# Patient Record
Sex: Female | Born: 1955
Health system: Southern US, Community
[De-identification: ages and names within clinical notes are randomized; demographics above are authoritative.]

---

## 2015-06-05 ENCOUNTER — Encounter: Payer: Self-pay | Admitting: Internal Medicine

## 2015-06-05 ENCOUNTER — Ambulatory Visit (INDEPENDENT_AMBULATORY_CARE_PROVIDER_SITE_OTHER): Payer: Self-pay | Admitting: Internal Medicine

## 2015-06-05 VITALS — BP 191/124 | HR 76 | Wt 285.0 lb

## 2015-06-05 DIAGNOSIS — Z90710 Acquired absence of both cervix and uterus: Secondary | ICD-10-CM | POA: Insufficient documentation

## 2015-06-05 DIAGNOSIS — I10 Essential (primary) hypertension: Secondary | ICD-10-CM | POA: Insufficient documentation

## 2015-06-05 DIAGNOSIS — R600 Localized edema: Secondary | ICD-10-CM

## 2015-06-05 DIAGNOSIS — L304 Erythema intertrigo: Secondary | ICD-10-CM | POA: Insufficient documentation

## 2015-06-05 DIAGNOSIS — L03119 Cellulitis of unspecified part of limb: Secondary | ICD-10-CM

## 2015-06-05 DIAGNOSIS — S82209A Unspecified fracture of shaft of unspecified tibia, initial encounter for closed fracture: Secondary | ICD-10-CM | POA: Insufficient documentation

## 2015-06-05 DIAGNOSIS — Z9049 Acquired absence of other specified parts of digestive tract: Secondary | ICD-10-CM | POA: Insufficient documentation

## 2015-06-05 DIAGNOSIS — S82409A Unspecified fracture of shaft of unspecified fibula, initial encounter for closed fracture: Secondary | ICD-10-CM

## 2015-06-05 DIAGNOSIS — E669 Obesity, unspecified: Secondary | ICD-10-CM | POA: Insufficient documentation

## 2015-06-05 DIAGNOSIS — Z0000 Encounter for general adult medical examination without abnormal findings: Secondary | ICD-10-CM

## 2015-06-05 LAB — BASIC METABOLIC PANEL
BUN: 13 mg/dL (ref 7–25)
CO2: 23 meq/L (ref 20–31)
CREATININE: 0.79 mg/dL (ref 0.50–1.05)
Calcium: 9.3 mg/dL (ref 8.6–10.4)
Chloride: 104 mEq/L (ref 98–110)
GLUCOSE: 103 mg/dL — AB (ref 65–99)
POTASSIUM: 4.3 meq/L (ref 3.5–5.3)
Sodium: 139 mEq/L (ref 135–146)

## 2015-06-05 LAB — CBC WITH DIFFERENTIAL/PLATELET
Basophils Absolute: 0 10*3/uL (ref 0.0–0.1)
Basophils Relative: 0 % (ref 0–1)
Eosinophils Absolute: 0.2 10*3/uL (ref 0.0–0.7)
Eosinophils Relative: 2 % (ref 0–5)
HCT: 43.6 % (ref 36.0–46.0)
HEMOGLOBIN: 14.4 g/dL (ref 12.0–15.0)
Lymphocytes Relative: 22 % (ref 12–46)
Lymphs Abs: 2 10*3/uL (ref 0.7–4.0)
MCH: 29.6 pg (ref 26.0–34.0)
MCHC: 33 g/dL (ref 30.0–36.0)
MCV: 89.5 fL (ref 78.0–100.0)
MPV: 9.7 fL (ref 8.6–12.4)
Monocytes Absolute: 0.8 10*3/uL (ref 0.1–1.0)
Monocytes Relative: 9 % (ref 3–12)
NEUTROS ABS: 6.2 10*3/uL (ref 1.7–7.7)
Neutrophils Relative %: 67 % (ref 43–77)
Platelets: 340 10*3/uL (ref 150–400)
RBC: 4.87 MIL/uL (ref 3.87–5.11)
RDW: 12.8 % (ref 11.5–15.5)
WBC: 9.2 10*3/uL (ref 4.0–10.5)

## 2015-06-05 LAB — HEPATITIS C ANTIBODY: HCV AB: NEGATIVE

## 2015-06-05 LAB — C-REACTIVE PROTEIN

## 2015-06-05 MED ORDER — HYDROCHLOROTHIAZIDE 25 MG PO TABS: 25.0000 mg | ORAL_TABLET | Freq: Every day | ORAL | Status: AC

## 2015-06-05 NOTE — Progress Notes (Addendum)
Rfv: community referral for cellulitis Subjective:    Patient ID: Sarah Joyce, female    DOB: 05-01-1956, 59 y.o.   MRN: [id number]  HPI Sarah Joyce is a 59yo F with HTN,obesity, ankle injury requiring rod & screw placement roughly 15 years ago. She sustained a spider bite to lateral aspect of right foot followed by  a bout of cellulitis to right leg and foot in mid may. She was given a total of 21 day course of cephalexin until improvement thru beginning of June. She had many recurrence of cellulitis, and has received several course of antibiotics. After keflex, she had a course of bactrim, then ceftriaxone injection followed again by bactrim course and most recently finished a course of moxifloxacin. Thus she has been on a course of antibiotics since mid may.  Has chronic erythema to her right leg, thought to be due to "leg trauma". She was referred by Dr. Thurston Joyce who felt that no surgical intervention at this time but referred her to ID for evaluation of further work up or treatment  No Known Allergies  No current outpatient prescriptions on file prior to visit.   No current facility-administered medications on file prior to visit.    Active Ambulatory Problems    Diagnosis Date Noted  . Essential hypertension 06/05/2015  . Recurrent cellulitis of lower leg 06/05/2015  . Obesity 06/05/2015  . H/O: hysterectomy 06/05/2015  . History of cholecystectomy 06/05/2015  . Intertrigo 06/05/2015   Resolved Ambulatory Problems    Diagnosis Date Noted  . No Resolved Ambulatory Problems   No Additional Past Medical History    History  Substance Use Topics  . Smoking status: Never Smoker   . Smokeless tobacco: Never Used  . Alcohol Use: Not on file   Family history = no family hx of immune deficiencies or lymphadema  Review of Systems Review of Systems  Constitutional: Negative for fever, chills, diaphoresis, activity change, appetite change, fatigue and unexpected weight change.    HENT: Negative for congestion, sore throat, rhinorrhea, sneezing, trouble swallowing and sinus pressure.  Eyes: Negative for photophobia and visual disturbance.  Respiratory: Negative for cough, chest tightness, shortness of breath, wheezing and stridor.  Cardiovascular: Negative for chest pain, palpitations and leg swelling.  Gastrointestinal: Negative for nausea, vomiting, abdominal pain, diarrhea, constipation, blood in stool, abdominal distention and anal bleeding.  Genitourinary: Negative for dysuria, hematuria, flank pain and difficulty urinating.  Musculoskeletal: Negative for myalgias, back pain, joint swelling, arthralgias and gait problem.  Skin: Negative for color change, pallor, rash and wound. Rash to right foot neuroological: Negative for dizziness, tremors, weakness and light-headedness.  Hematological: Negative for adenopathy. Does not bruise/bleed easily.  Psychiatric/Behavioral: Negative for behavioral problems, confusion, sleep disturbance, dysphoric mood, decreased concentration and agitation.       Objective:   Physical Exam BP 210/115 mmHg  Pulse 77  Wt 285 lb (129.275 kg), repeat BP 190s Physical Exam  Constitutional:  oriented to person, place, and time. appears well-developed and well-nourished. No distress.  HENT: [place]/AT, PERRLA, no scleral icterus Mouth/Throat: Oropharynx is clear and moist. No oropharyngeal exudate.  Cardiovascular: Normal rate, regular rhythm and normal heart sounds. Exam reveals no gallop and no friction rub.  No murmur heard.  Pulmonary/Chest: Effort normal and breath sounds normal. No respiratory distress.  has no wheezes.  Neck = supple, no nuchal rigidity Lymphadenopathy: no cervical adenopathy. No axillary adenopathy Skin: Skin is warm and dry.  Erythema to lateral aspect of foot Ext: non pitting edema to  right leg   Lab Results  Component Value Date   ESRSEDRATE 12 06/05/2015   Lab Results  Component Value Date   CRP <0.5  06/05/2015       Assessment & Plan:  Possibly deep tissue infection of right foot = will check sed rate and crp. Will get foot CT. Consider also vascular evaluation. Will do a trial of ace wrap.   htn = seems still high despite having white coat syndrome. Will start hctz to see if that also helps BP as well as with lower extremity edema. Will have her get BMP early next week to see if she is having significant potassium loss which she would require oral replacement  Health maintenance = will check hiv and hep c antibody per CDC recs

## 2015-06-06 ENCOUNTER — Telehealth: Payer: Self-pay | Admitting: *Deleted

## 2015-06-06 LAB — HIV ANTIBODY (ROUTINE TESTING W REFLEX): HIV 1&2 Ab, 4th Generation: NONREACTIVE

## 2015-06-06 LAB — SEDIMENTATION RATE: Sed Rate: 12 mm/hr (ref 0–30)

## 2015-06-06 NOTE — Telephone Encounter (Signed)
Called the patient to give her appt information for the CT Foot. She is to be at [facility] radiology 06/11/15 at 745 for 800 appt. If she can not make it will give her [phone] to call and reschedule.

## 2015-06-11 ENCOUNTER — Ambulatory Visit (HOSPITAL_COMMUNITY)
Admission: RE | Admit: 2015-06-11 | Discharge: 2015-06-11 | Disposition: A | Discharge status: Home / Self Care | Payer: Self-pay | Source: Ambulatory Visit | Attending: Internal Medicine | Admitting: Internal Medicine

## 2015-06-11 DIAGNOSIS — L03119 Cellulitis of unspecified part of limb: Secondary | ICD-10-CM

## 2015-06-11 NOTE — Telephone Encounter (Signed)
Radiology at [facility] called stating that they could not perform a CT w/wo contrast, they tried to contact us this morning but was not successful with getting through so Radiology sent the patient home. Radiologist recommends a MRI instead of CT if looking for infection. Radiologist states that they could do a CT w/o contrast if Dr. Drue Second still wants a CT scan.

## 2015-06-18 NOTE — Telephone Encounter (Signed)
Patient is still waiting on an answer so she can get her imaging done, also she is in a high amount of pain since being off the antibiotics. Please advise, patient is really concerned.

## 2015-06-21 ENCOUNTER — Telehealth: Payer: Self-pay | Admitting: *Deleted

## 2015-06-21 ENCOUNTER — Encounter: Payer: Self-pay | Admitting: Internal Medicine

## 2015-06-21 NOTE — Telephone Encounter (Signed)
Called the patient to advise her of the appt for her CT 06/26/15 at 130 pm at [facility]. The patient advised her foot is still swollen red and painful. Advised the only increase is in the pain near the bite everything else is the same. Advised her will let the doctor know and either she will call her or I will.  Dr Drue Second informed verbally.

## 2015-06-21 NOTE — Telephone Encounter (Signed)
Sarah Joyce   I am working on this right now I will try and call you shortly and have an appointment for you.  [name]

## 2015-06-22 ENCOUNTER — Telehealth: Payer: Self-pay | Admitting: Internal Medicine

## 2015-06-22 NOTE — Telephone Encounter (Signed)
Spoke to patient regarding her symptoms. She mainly complains of tightness in her leg associated with swelling. No significant increase in erythema at prior site that was involved. Recommended to keep her leg elevated. Compression stalkings. Continue with diuretics. Plan for leg CT (she has hx of hardware in her foot and ankle from remote surgery 10-65yr, not sure if it is compatible with mri)

## 2015-06-26 ENCOUNTER — Other Ambulatory Visit: Payer: Self-pay | Admitting: Internal Medicine

## 2015-06-26 ENCOUNTER — Ambulatory Visit (HOSPITAL_COMMUNITY)
Admission: RE | Admit: 2015-06-26 | Discharge: 2015-06-26 | Disposition: A | Discharge status: Home / Self Care | Payer: Self-pay | Source: Ambulatory Visit | Attending: Internal Medicine | Admitting: Internal Medicine

## 2015-06-26 ENCOUNTER — Encounter (HOSPITAL_COMMUNITY): Payer: Self-pay

## 2015-06-26 DIAGNOSIS — L03119 Cellulitis of unspecified part of limb: Secondary | ICD-10-CM

## 2015-06-26 DIAGNOSIS — Z8781 Personal history of (healed) traumatic fracture: Secondary | ICD-10-CM | POA: Insufficient documentation

## 2015-06-26 DIAGNOSIS — M1990 Unspecified osteoarthritis, unspecified site: Secondary | ICD-10-CM | POA: Insufficient documentation

## 2015-06-26 MED ORDER — IOHEXOL 350 MG/ML SOLN
80.0000 mL | Freq: Once | INTRAVENOUS | Status: AC | PRN
  Administered 2015-06-26: 80 mL via INTRAVENOUS

## 2015-07-04 NOTE — Telephone Encounter (Signed)
Patient had CT foot done 06/28/15 and is planing to keep appt set to see Dr Drue Second 07/12/15.

## 2015-07-12 ENCOUNTER — Ambulatory Visit (INDEPENDENT_AMBULATORY_CARE_PROVIDER_SITE_OTHER): Payer: Self-pay | Admitting: Internal Medicine

## 2015-07-12 ENCOUNTER — Encounter: Payer: Self-pay | Admitting: Internal Medicine

## 2015-07-12 VITALS — BP 195/119 | HR 82 | Temp 98.3°F | Wt 284.0 lb

## 2015-07-12 DIAGNOSIS — I10 Essential (primary) hypertension: Secondary | ICD-10-CM

## 2015-07-12 DIAGNOSIS — L309 Dermatitis, unspecified: Secondary | ICD-10-CM

## 2015-07-12 DIAGNOSIS — S91301A Unspecified open wound, right foot, initial encounter: Secondary | ICD-10-CM

## 2015-07-12 MED ORDER — COLLAGENASE 250 UNIT/GM EX OINT: 1.0000 | TOPICAL_OINTMENT | Freq: Every day | CUTANEOUS | Status: AC

## 2015-07-12 NOTE — Progress Notes (Signed)
Subjective:    Patient ID: Sarah Joyce, female    DOB: [birth year], 59 y.o.   MRN: [id number]  HPI 58yo F with HTN, recurrent cellulitis of leg, venous stasis changes. She was seen in July for recurrent cellulitis nad had been on intermittent oral antibiotics. At that time her inflammatory markers, wbc had been normal. She did undergo CT of right lower leg that showed evidence of edema, HW in place, but no abscess or inflammation to be concerned for myositis, cellulitis. Since we last saw her,  She has noticed right ulcer to medial aspect of midway dorsum of foot, inferior medial malleolus thinks it maybe due to her shoes. Lateral aspect of foot has red patch of flaky skin, pruritic which she itches. No other worsening erythema on dorsum of foot or swelling noted. She does have pitting edema to lower extremities and venous stasis changes. Not currently on any oral antibiotics, she uses neosporin to her ulcer on medial aspect of right foot  She does see her pcp and her bp at their office sBP 140s. Though, it is higher here. She denies any Headache  i have reviewed imaging from evaluation of right leg.  Current Outpatient Prescriptions on File Prior to Visit  Medication Sig Dispense Refill  . hydrochlorothiazide (HYDRODIURIL) 25 MG tablet Take 1 tablet (25 mg total) by mouth daily. 30 tablet 11  . HYDROcodone-acetaminophen (NORCO/VICODIN) 5-325 MG per tablet Take 1 tablet by mouth every 6 (six) hours as needed for moderate pain.    Marland Kitchen moxifloxacin (AVELOX) 400 MG tablet Take 400 mg by mouth daily at 8 pm.     No current facility-administered medications on file prior to visit.   Active Ambulatory Problems    Diagnosis Date Noted  . Recurrent cellulitis of lower leg 06/05/2015  . Obesity 06/05/2015  . H/O: hysterectomy 06/05/2015  . History of cholecystectomy 06/05/2015  . Intertrigo 06/05/2015  . Tibia/fibula fracture, shaft 06/05/2015   Resolved Ambulatory Problems    Diagnosis Date  Noted  . Essential hypertension 06/05/2015   No Additional Past Medical History     Review of Systems 10 point ros is negative except what is mentioned in hpi    Objective:   Physical Exam BP 195/119 mmHg  Pulse 82  Temp(Src) 98.3 F (36.8 C) (Oral)  Wt 284 lb (128.822 kg) gen = a x o by 3 in nad heent = no thrush Ext = trace edema up 2/3rd of shin Skin = right foot shows thtat she has a 1 x 1 cm shallow ulcer to medial aspect of dorsum of foot. Fibrinous exudate in wound bed. Lateral aspect of right foot has 4 cm high x 7 cm long dry, flaking patch. No blanching not raised  Imaging: 8/16 leg CT FINDINGS: There is no evidence of osteomyelitis, joint effusion, tenosynovitis, or soft tissue abscess. The patient has subcutaneous edema in the soft tissues of the dorsal lateral aspect of the forefoot and prominent subcutaneous edema adjacent to the medial malleolus. This less prominent subcutaneous edema adjacent to the lateral malleolus. There is moderate arthritis of the ankle joint with multiple subcortical cysts in the dome of the talus with joint space narrowing. Side plate and multiple screws in the distal fibula. The previous fracture has completely healed.  IMPRESSION: No evidence of osteomyelitis, myositis, or soft tissue abscess.  Subcutaneous edema around the ankle and on the dorsum of the foot which can represent cellulitis.  The hardware in the distal fibula is not a  contraindication to MRI if needed in the future although the hardware will obscure some detail at the level of the ankle joint.    Assessment & Plan:  Clinically it looks like lateral lesion is not consistent with cellulitis, but dermatitis, will have her do 50-50 vaseline with eucerin  For medial ulcer = has fibrinous exudate in 1 x 1 cm wound bed. Will recommend santyl daily til wound is clean then stop and let heal on its own. No need for antibiotics  htn = on repeat still elevated.  Recommend to continue with lisinopril and also have her follow up with pcp to change her regimen if needed.

## 2015-07-20 ENCOUNTER — Encounter: Payer: Self-pay | Admitting: Internal Medicine

## 2015-07-27 ENCOUNTER — Encounter: Payer: Self-pay | Admitting: Internal Medicine

## 2015-07-30 ENCOUNTER — Encounter: Payer: Self-pay | Admitting: *Deleted

## 2015-08-28 ENCOUNTER — Ambulatory Visit: Payer: Self-pay | Admitting: Internal Medicine

## 2015-09-15 IMAGING — CT CT FOOT*R* W/CM
2 series · 13 of 28 positions shown, 16 images · IV contrast (omnipaque)
Comparison: None.

CLINICAL DATA: Recurrent cellulitis of the lower leg. Spider bite 3
months ago.

EXAM:
CT OF THE RIGHT FOOT WITH CONTRAST
TECHNIQUE: Multidetector CT imaging was performed following the standard
protocol during bolus administration of intravenous contrast.
CONTRAST:  80mL OMNIPAQUE IOHEXOL 350 MG/ML SOLN

[Series 4: lfov ext 3.0 b40s · axial · 0.47mm/px · z∈[-254,-78]mm · 8 of 71 slices shown, 10 images]
[im 6/71  soft-tissue]
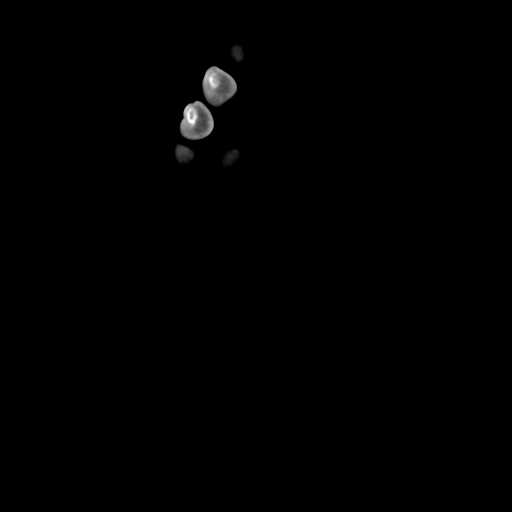
[im 6/71  bone]
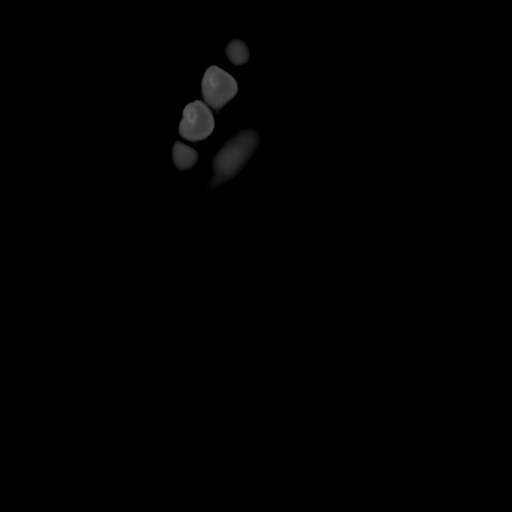
[im 17/71  bone]
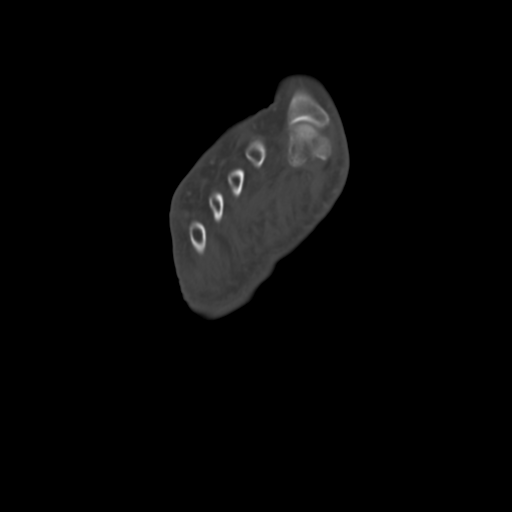
[im 22/71  bone]
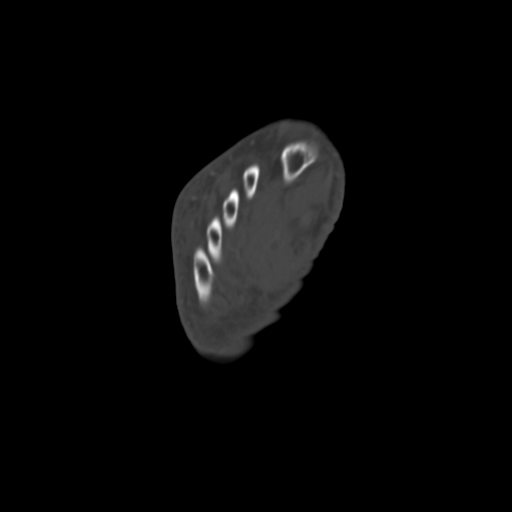
[im 33/71  bone]
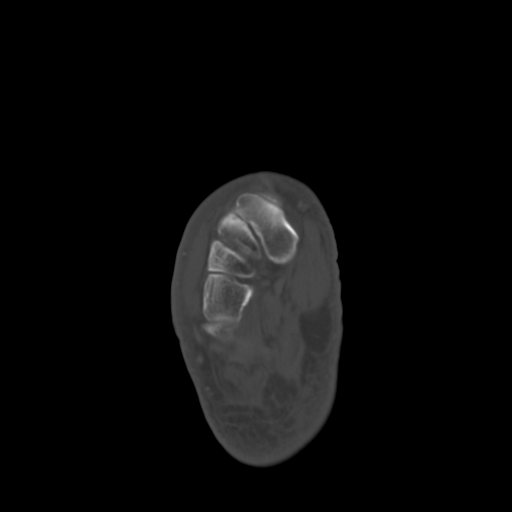
[im 38/71  soft-tissue]
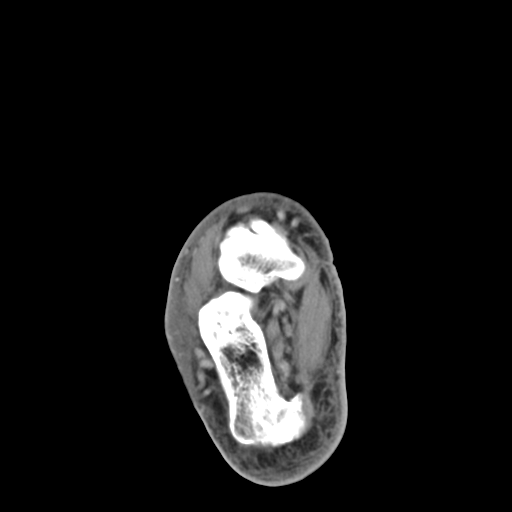
[im 38/71  bone]
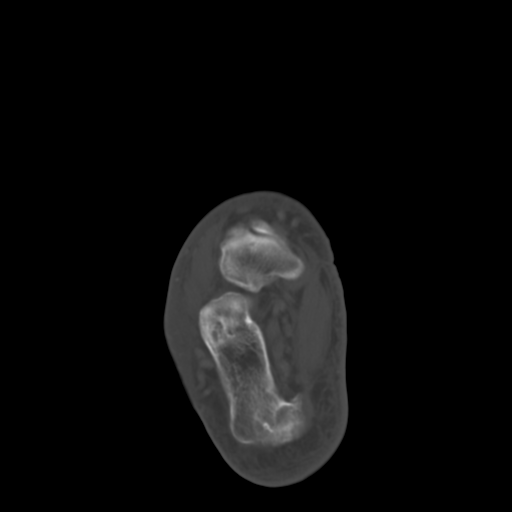
[im 49/71  bone]
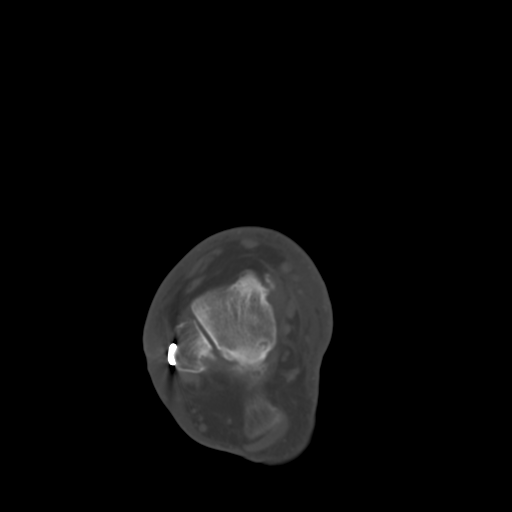
[im 54/71  bone]
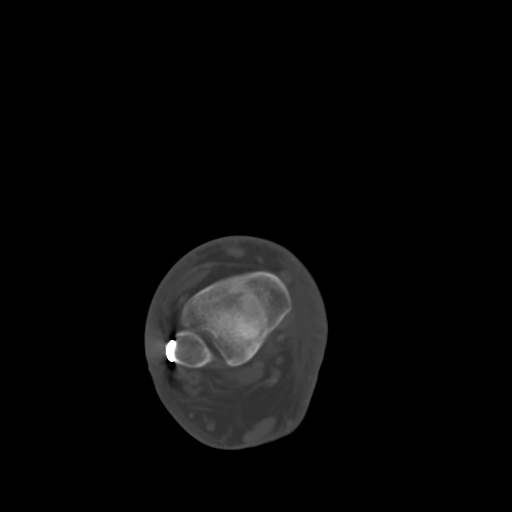
[im 65/71  bone]
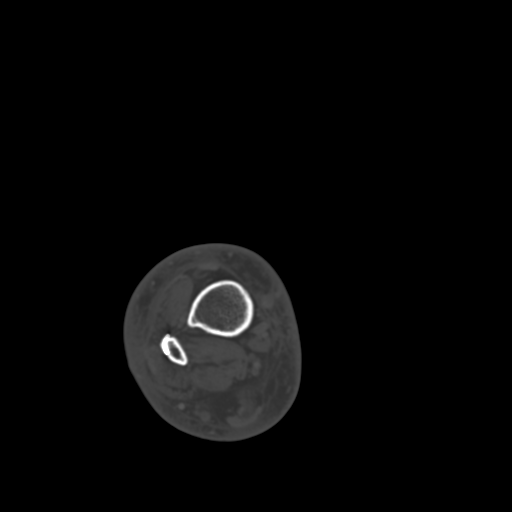

[Series 10: sagittalsoft tissue · sagittal · 0.37mm/px · 5 of 54 slices shown, 6 images]
[im 18/54  bone]
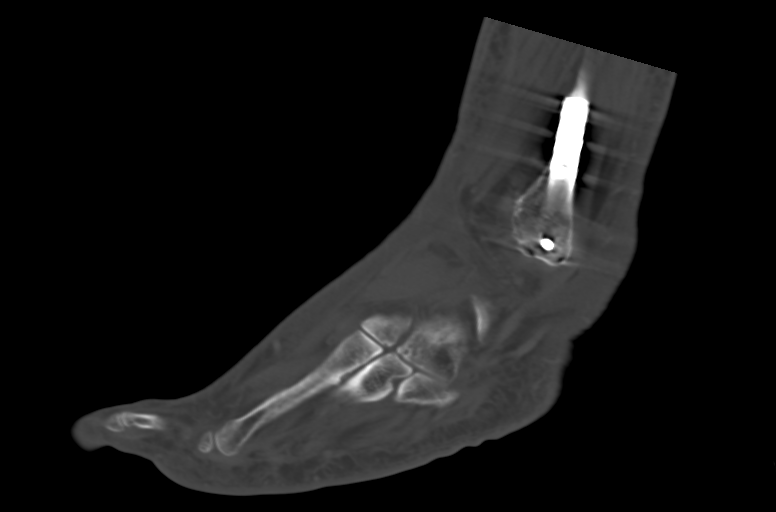
[im 23/54  bone]
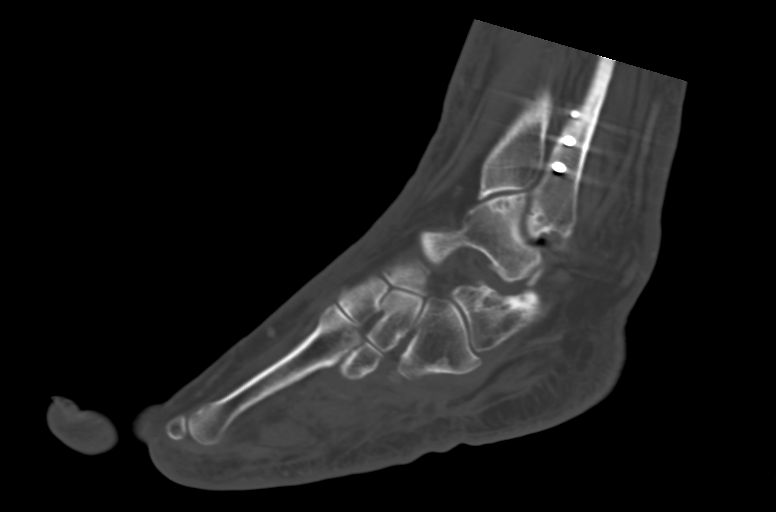
[im 27/54  soft-tissue]
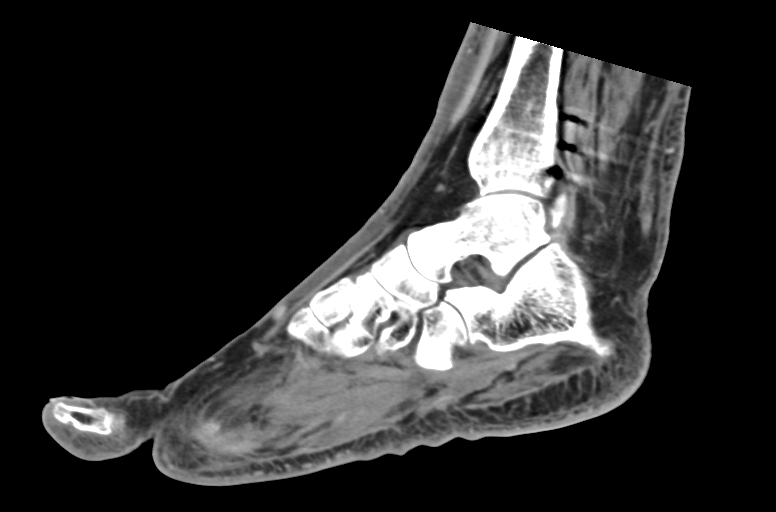
[im 27/54  bone]
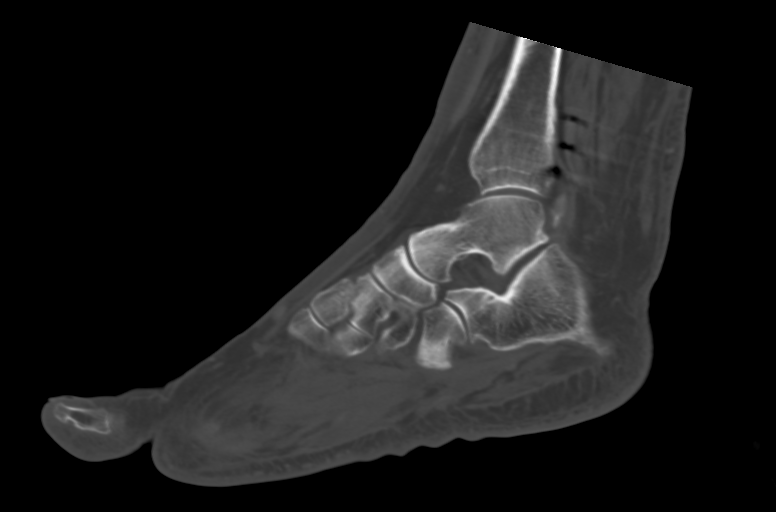
[im 31/54  bone]
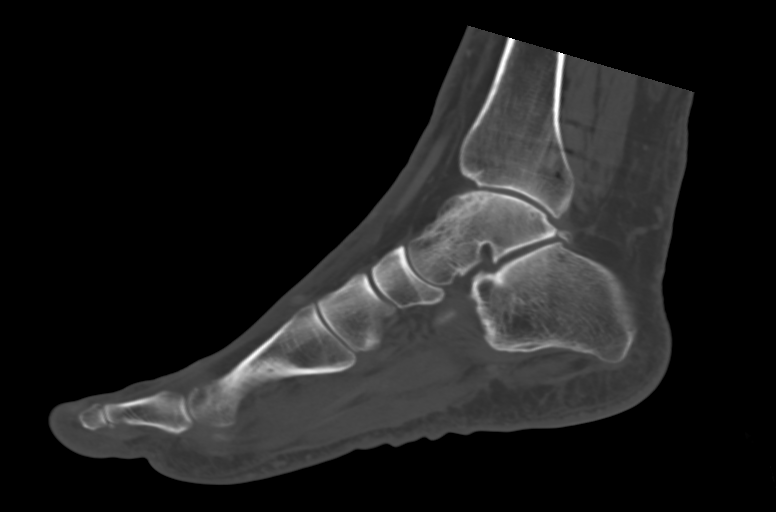
[im 36/54  bone]
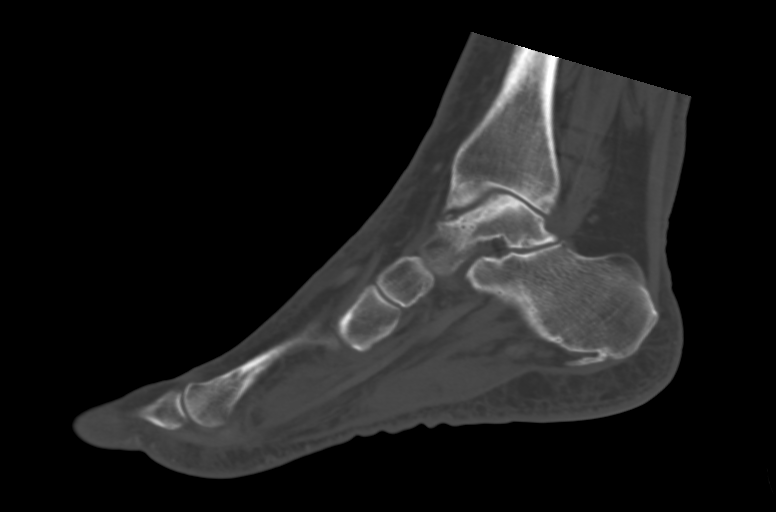

[13 of 28 positions shown; findings below may reference images not displayed]

FINDINGS: There is no evidence of osteomyelitis, joint effusion,
tenosynovitis, or soft tissue abscess. The patient has subcutaneous
edema in the soft tissues of the dorsal lateral aspect of the
forefoot and prominent subcutaneous edema adjacent to the medial
malleolus. This less prominent subcutaneous edema adjacent to the
lateral malleolus. There is moderate arthritis of the ankle joint
with multiple subcortical cysts in the dome of the talus with joint
space narrowing. Side plate and multiple screws in the distal
fibula. The previous fracture has completely healed.
IMPRESSION: No evidence of osteomyelitis, myositis, or soft tissue abscess.

Subcutaneous edema around the ankle and on the dorsum of the foot
which can represent cellulitis.

The hardware in the distal fibula is not a contraindication to MRI
if needed in the future although the hardware will obscure some
detail at the level of the ankle joint.

## 2016-07-25 ENCOUNTER — Ambulatory Visit (HOSPITAL_COMMUNITY): Payer: Self-pay | Admitting: Physical Therapy

## 2016-07-31 ENCOUNTER — Ambulatory Visit (HOSPITAL_COMMUNITY): Payer: Self-pay | Attending: Family Medicine | Admitting: Physical Therapy

## 2016-07-31 DIAGNOSIS — L97312 Non-pressure chronic ulcer of right ankle with fat layer exposed: Secondary | ICD-10-CM | POA: Insufficient documentation

## 2016-07-31 DIAGNOSIS — M79671 Pain in right foot: Secondary | ICD-10-CM | POA: Insufficient documentation

## 2016-07-31 NOTE — Therapy (Signed)
[facility] [address] [place], [place], [address] Phone: [phone]   Fax:  [phone]  Physical Therapy Evaluation  Patient Details  Name: Sarah Joyce MRN: [id number] Date of Birth: 1955/11/17 Referring Provider: Fara Chute  Encounter Date: 07/31/2016      PT End of Session - 07/31/16 1547    Visit Number 1   Number of Visits 8   Date for PT Re-Evaluation 08/30/16   Authorization Type no insurance   PT Start Time 1435   PT Stop Time 1510   PT Time Calculation (min) 35 min   Activity Tolerance Patient tolerated treatment well   Behavior During Therapy [facility] for tasks assessed/performed      No past medical history on file.  No past surgical history on file.  There were no vitals filed for this visit.       Subjective Assessment - 07/31/16 1533    Subjective Ms. Lokey states that she was bit by a brown recluce spider approximately 15 months ago and has had problems with wounds that come and go ever since.   Her current wounds which are located on the medial aspect of her ankle have been there for five months.  She has had five bouts of antibiotics and is now being referred to skilled therapy .    Patient is accompained by: Family member   Pertinent History HTN, (-) for diabetes, (-) for arterial insufficiency.    How long can you sit comfortably? no problem    How long can you stand comfortably? no difficulty   How long can you walk comfortably? no difficulty    Currently in Pain? Yes   Pain Score 7    Pain Location Ankle   Pain Orientation Right;Medial   Pain Descriptors / Indicators Aching   Pain Onset More than a month ago   Pain Frequency Intermittent   Aggravating Factors  hitting the inside of her ankle    Pain Relieving Factors medication    Effect of Pain on Daily Activities increases pain    Multiple Pain Sites No            [facility] PT Assessment - 07/31/16 0001      Assessment   Medical Diagnosis  nonhealing wound    Referring Provider Fara Chute   Onset Date/Surgical Date 02/28/16   Next MD Visit unknown   Prior Therapy 5 antibiotics; santyl      Precautions   Precautions None     Restrictions   Weight Bearing Restrictions No     Balance Screen   Has the patient fallen in the past 6 months No   Has the patient had a decrease in activity level because of a fear of falling?  No   Is the patient reluctant to leave their home because of a fear of falling?  No     Home [employer] information centre manager residence     Prior Function   Level of Independence Independent   Vocation Part time employment   Vocation Requirements standing/sitting    Leisure none      Cognition   Overall Cognitive Status Within Functional Limits for tasks assessed     Observation/Other Assessments   Observations Rt LE has increased edema; increased heat and is red.  There are multiple small scattered wounds along the medial aspect of the patient's ankle.  The pt states that these wounds tend to drain moderately. Noted varicosities throughout LE  Observation/Other Assessments-Edema    Edema --  posititve                    [facility] Adult PT Treatment/Exercise - 07/31/16 0001      Manual Therapy   Manual Therapy Compression Bandaging   Compression Bandaging profore system using extra cotton to ensure a cone shape prior to other layers of compression.                 PT Education - 07/31/16 1545    Education provided Yes   Education Details Take bandaging off if they become to painful; keep bandages on and do not get wet if they remain comfortable.  The benefits of compression hose. ( pt states that she normally wears compression hose    Person(s) Educated Patient   Methods Explanation   Comprehension Verbalized understanding          PT Short Term Goals - 07/31/16 1556      PT SHORT TERM GOAL #1   Title Pt LE to show no signs of infection    Time 1    Period Weeks   Status New     PT SHORT TERM GOAL #2   Title Pt pain to be decreaed to no more than a 4/10 to allow pt to be on her feet for over an hour for work duties    Time 1   Period Weeks   Status New     PT SHORT TERM GOAL #3   Title Pt wound drainage to be minimal to decrease risk of infection    Time 1   Period Weeks           PT Long Term Goals - 07/31/16 1557      PT LONG TERM GOAL #1   Title Pt wounds to be healed    Time 4   Period Weeks   Status New     PT LONG TERM GOAL #2   Title Pt acknowledge the importance of wearing compression garments everyday; have obtained and to be able to don and doff compression garments.    Time 4   Period Weeks   Status New               Plan - 07/31/16 1548    Clinical Impression Statement Ms. Katrinka BlazingSmith is a 60 yo female who has had multiple non-healing wounds for the past 15 months which she attributes to a spider bite.  The most recent wound has been on the medial aspect of her right ankle for the past five months.  She has been given five antibiotics and santyl to place on the wound with no improvement.  The pt LE has incresed edema with noted varicosities.  The LE is warm to the touch and red.  Ms. Katrinka BlazingSmith will benefit from skilled physical therapy to decrease her edema, decrease her pain and provice the right enviornment for her wounds to heal.     Rehab Potential Good   PT Frequency 2x / week   PT Duration 4 weeks   PT Treatment/Interventions ADLs/Self Care Home Management;Manual techniques;Manual lymph drainage;Compression bandaging;Other (comment)  debridement if needed    PT Next Visit Plan Pt may benefit from manual decongestive techniques if heat and redness are decreased, continue to use compression bandaging and give pt information on copression outlet center.    Consulted and Agree with Plan of Care Patient      Patient will benefit from skilled  therapeutic intervention in order to improve the following  deficits and impairments:  Increased edema, Pain, Other (comment) (nonhealing wounds )  Visit Diagnosis: Pain in right foot - Plan: PT plan of care cert/re-cert  Non-pressure chronic ulcer of ankle with fat layer exposed, right (HCC) - Plan: PT plan of care cert/re-cert     Problem List Patient Active Problem List   Diagnosis Date Noted  . Recurrent cellulitis of lower leg 06/05/2015  . Obesity 06/05/2015  . H/O: hysterectomy 06/05/2015  . History of cholecystectomy 06/05/2015  . Intertrigo 06/05/2015  . Tibia/fibula fracture, shaft 06/05/2015    Virgina Organ, PT CLT [phone] 07/31/2016, 4:05 PM  [facility] [address] [place], [place], [address] Phone: [phone]   Fax:  [phone]  Name: Raileigh Sabater MRN: [id number] Date of Birth: November 14, 1955

## 2016-08-05 ENCOUNTER — Ambulatory Visit (HOSPITAL_COMMUNITY): Payer: Self-pay | Attending: Family Medicine

## 2016-08-05 DIAGNOSIS — M79671 Pain in right foot: Secondary | ICD-10-CM

## 2016-08-05 DIAGNOSIS — L97312 Non-pressure chronic ulcer of right ankle with fat layer exposed: Secondary | ICD-10-CM

## 2016-08-05 NOTE — Therapy (Signed)
[facility] [address] [place], [place], [address] Phone: [phone]   Fax:  [phone]  Physical Therapy Treatment  Patient Details  Name: Sarah LawsStephanie Joyce MRN: [id number] Date of Birth: [birth year] Referring Provider: Fara ChutePaul Sasser  Encounter Date: 08/05/2016      PT End of Session - 08/05/16 1404    Visit Number 2   Number of Visits 8   Date for PT Re-Evaluation 08/30/16   Authorization Type no insurance   PT Start Time 1300   PT Stop Time 1348   PT Time Calculation (min) 48 min   Activity Tolerance Patient tolerated treatment well   Behavior During Therapy [facility] for tasks assessed/performed      No past medical history on file.  No past surgical history on file.  There were no vitals filed for this visit.      Subjective Assessment - 08/05/16 1406    Subjective Pt entered dept with two outer layers of dressinmgs removed, stated they were too tight and painful.  No reports of pain today   Pertinent History HTN, (-) for diabetes, (-) for arterial insufficiency.    Currently in Pain? No/denies           Wound Therapy - 08/05/16 1407    Subjective Pt entered dept with two outer layers of dressinmgs removed, stated they were too tight and painful.  No reports of pain today   Patient and Family Stated Goals wound to heal   Date of Onset --  15 months ago   Pain Assessment No/denies pain   Pain Score 0-No pain   Pain Location Ankle   Pain Orientation Right;Medial   Pain Descriptors / Indicators Aching   Evaluation and Treatment Procedures Explained to Patient/Family Yes   Evaluation and Treatment Procedures agreed to          [facility] Adult PT Treatment/Exercise - 08/05/16 0001      Manual Therapy   Manual Therapy Compression Bandaging   Manual therapy comments Selective debridement for removal of slough and dry skin.  Retro massage for edema control; Cleansed and debridement with silver hydrofiber dressings and  profore with extra cotton    Compression Bandaging profore system using extra cotton to ensure a cone shape prior to other layers of compression.             PT Short Term Goals - 07/31/16 1556      PT SHORT TERM GOAL #1   Title Pt LE to show no signs of infection    Time 1   Period Weeks   Status New     PT SHORT TERM GOAL #2   Title Pt pain to be decreaed to no more than a 4/10 to allow pt to be on her feet for over an hour for work duties    Time 1   Period Weeks   Status New     PT SHORT TERM GOAL #3   Title Pt wound drainage to be minimal to decrease risk of infection    Time 1   Period Weeks           PT Long Term Goals - 07/31/16 1557      PT LONG TERM GOAL #1   Title Pt wounds to be healed    Time 4   Period Weeks   Status New     PT LONG TERM GOAL #2   Title Pt acknowledge the importance of wearing compression garments everyday;  have obtained and to be able to don and doff compression garments.    Time 4   Period Weeks   Status New               Plan - 08/05/16 1708    Clinical Impression Statement Pt entered dept with 2 of the outer layers removed from dressings due to discomfort, noted dressings have slid down LE.   Pt stated overall redness has reduced for LE.  Selective debridement complete to remove slough and overall dry skin.  Retro massage complete for edema control and continued with silver hydrofiber with vaseline applied to skin.  Applied profore for edema control lighter for comfort.  No reports of pain through session.     Rehab Potential Good   PT Frequency 2x / week   PT Duration 4 weeks   PT Treatment/Interventions ADLs/Self Care Home Management;Manual techniques;Manual lymph drainage;Compression bandaging;Other (comment)   PT Next Visit Plan Pt may benefit from manual decongestive techniques if heat and redness are decreased, continue to use compression bandaging and give pt information on compression outlet center.        Patient will benefit from skilled therapeutic intervention in order to improve the following deficits and impairments:  Increased edema, Pain, Other (comment) (nonhealing wound)  Visit Diagnosis: Pain in right foot  Non-pressure chronic ulcer of ankle with fat layer exposed, right [facility])     Problem List Patient Active Problem List   Diagnosis Date Noted  . Recurrent cellulitis of lower leg 06/05/2015  . Obesity 06/05/2015  . H/O: hysterectomy 06/05/2015  . History of cholecystectomy 06/05/2015  . Intertrigo 06/05/2015  . Tibia/fibula fracture, shaft 06/05/2015   Sarah Joyce, LPTA; CBIS [phone]  Sarah Joyce 08/05/2016, 5:17 PM  [facility] [address] [place], [place], [address] Phone: [phone]   Fax:  [phone]  Name: Sarah Joyce MRN: [id number] Date of Birth: Mar 15, 1956

## 2016-08-06 ENCOUNTER — Ambulatory Visit (HOSPITAL_COMMUNITY): Payer: Self-pay | Admitting: Physical Therapy

## 2016-08-06 DIAGNOSIS — M79671 Pain in right foot: Secondary | ICD-10-CM

## 2016-08-06 DIAGNOSIS — L97312 Non-pressure chronic ulcer of right ankle with fat layer exposed: Secondary | ICD-10-CM

## 2016-08-06 NOTE — Therapy (Signed)
[facility] [address] [place], [place], [address] Phone: [phone]   Fax:  [phone]  Physical Therapy Treatment  Patient Details  Name: Sarah LawsStephanie Joyce MRN: [id number] Date of Birth: Apr 17, 1956 Referring Provider: Fara ChutePaul Sasser  Encounter Date: 08/06/2016      PT End of Session - 08/06/16 1552    Visit Number 3   Number of Visits 8   Date for PT Re-Evaluation 08/30/16   Authorization Type no insurance   PT Start Time 1123   PT Stop Time 1200   PT Time Calculation (min) 37 min   Activity Tolerance Patient tolerated treatment well   Behavior [facility] Therapy [facility] for [facility] assessed/performed      No past medical history on file.  No past surgical history on file.  There were no vitals filed for this visit.      Subjective Assessment - 08/06/16 1557    Subjective Pt called asking if she could be seen today.  STates she noticed increased drainage on the lateral side of her leg as her bandage felt like it was stuck to it.  States it is the location of a spider bite from 15 months ago that was healed.    Currently in Pain? Yes   Pain Score 5    Pain Location Leg   Pain Orientation Medial;Lateral   Pain Descriptors / Indicators Burning                       Wound Therapy - 08/06/16 1547    Subjective Pt called asking if she could be seen today.  STates she noticed increased drainage on the lateral side of her leg as her bandage felt like it was stuck to it.  States it is the location of a spider bite from 15 months ago that was healed.    Patient and Family Stated Goals wound to heal   Date of Onset --  15 months ago   Pain Assessment 0-10   Pain Score 5    Pain Location Leg   Pain Orientation Medial;Lateral   Pain Descriptors / Indicators Burning   Pain Onset With Activity   Patients Stated Pain Goal 0   Evaluation and Treatment Procedures Explained to Patient/Family --   Evaluation and Treatment  Procedures --          [facility]/Exercise - 08/06/16 0001      Manual Therapy   Manual Therapy Compression Bandaging   Manual therapy comments Selective debridement for removal of slough and dry skin.  Retro massage for edema control; Cleansed and debridement with silver hydrofiber dressings and profore with extra cotton    Compression Bandaging medihoney colloid sheet f/b profore system using extra cotton to ensure a cone shape prior to other layers of compression.                   PT Short Term Goals - 07/31/16 1556      PT SHORT TERM GOAL #1   Title Pt LE to show no signs of infection    Time 1   Period Weeks   Status New     PT SHORT TERM GOAL #2   Title Pt pain to be decreaed to no more than a 4/10 to allow pt to be on her feet for over an hour for work duties    Time 1   Period Weeks   Status New  PT SHORT TERM GOAL #3   Title Pt wound drainage to be minimal to decrease risk of infection    Time 1   Period Weeks           PT Long Term Goals - 07/31/16 1557      PT LONG TERM GOAL #1   Title Pt wounds to be healed    Time 4   Period Weeks   Status New     PT LONG TERM GOAL #2   Title Pt acknowledge the importance of wearing compression garments everyday; have obtained and to be able to don and doff compression garments.    Time 4   Period Weeks   Status New               Plan - 08/06/16 1552    Clinical Impression Statement Pt with coban layer removed from wrap.  CG reports she peeked in and seen an area on the lateral aspect.  Pt very sensitive and would not allow debridement of this area.  appears to be a scabbed area approximately 0.5cm2 in size.  Medial side with 3-4 small scattered areas and overall irritation/redness on both sides.  CG and pateint report the redness is much improved from last couple sessions.  Used medihoney colloid over these areas to promote drainage and softening of scab.  Continued with profore  system to provide adequate compression.  pt with massive induration in this leg.  overall comfort at end of session.    Rehab Potential Good   PT Frequency 2x / week   PT Duration 4 weeks   PT Treatment/Interventions ADLs/Self Care Home Management;Manual techniques;Manual lymph drainage;Compression bandaging;Other (comment)   PT Next Visit Plan Pt may benefit from manual decongestive techniques if heat and redness are decreased, continue to use compression bandaging and give pt information on compression outlet center.       Patient will benefit from skilled therapeutic intervention in order to improve the following deficits and impairments:  Increased edema, Pain, Other (comment) (nonhealing wound)  Visit Diagnosis: No diagnosis found.     Problem List Patient Active Problem List   Diagnosis Date Noted  . Recurrent cellulitis of lower leg 06/05/2015  . Obesity 06/05/2015  . H/O: hysterectomy 06/05/2015  . History of cholecystectomy 06/05/2015  . Intertrigo 06/05/2015  . Tibia/fibula fracture, shaft 06/05/2015    [name], PTA/CLT [phone]  08/06/2016, 3:57 PM  [facility] [facility] [address] [place], [place], [address] Phone: [phone]   Fax:  [phone]  Name: Sarah Joyce MRN: [id number] Date of Birth: [birth year]

## 2016-08-11 ENCOUNTER — Ambulatory Visit (HOSPITAL_COMMUNITY): Payer: Self-pay | Attending: Family Medicine | Admitting: Physical Therapy

## 2016-08-11 DIAGNOSIS — M79671 Pain in right foot: Secondary | ICD-10-CM | POA: Insufficient documentation

## 2016-08-11 DIAGNOSIS — L97312 Non-pressure chronic ulcer of right ankle with fat layer exposed: Secondary | ICD-10-CM | POA: Insufficient documentation

## 2016-08-11 NOTE — Therapy (Signed)
[facility] [address] [place], [place], [address] Phone: [phone]   Fax:  [phone]  Wound Care Therapy  Patient Details  Name: Sarah Joyce MRN: [id number] Date of Birth: 1956/10/29 Referring Provider: Fara Chute  Encounter Date: 08/11/2016      PT End of Session - 08/11/16 1731    Visit Number 4   Number of Visits 8   Date for PT Re-Evaluation 08/30/16   Authorization Type no insurance   PT Start Time 1300   PT Stop Time 1340   PT Time Calculation (min) 40 min   Activity Tolerance Patient tolerated treatment well   Behavior During Therapy [facility] for tasks assessed/performed      No past medical history on file.  No past surgical history on file.  There were no vitals filed for this visit.                  Wound Therapy - 08/11/16 1345    Subjective Pt comes today using SPC with CG and withou outer 2 layers removed from wrap   Pain Assessment 0-10   Pain Score 5    Pain Type Acute pain   Pain Location Leg   Pain Descriptors / Indicators Burning   Wound Properties Date First Assessed: 08/11/16 Time First Assessed: 1344 Wound Type: Other (Comment) Location: Foot Location Orientation: Right;Lateral Wound Description (Comments): lateral wound (spider bite) Present on Admission: No   Dressing Type Gauze (Comment)  medihoney colloid   Dressing Changed Changed   Dressing Status Old drainage   Dressing Change Frequency Every 3 days   Site / Wound Assessment Painful;Pink;Yellow   % Wound base Red or Granulating 0%   % Wound base Yellow 100%   Peri-wound Assessment Erythema (blanchable);Edema;Induration   Wound Length (cm) 1 cm   Wound Width (cm) 1 cm   Wound Depth (cm) --  unknown   Margins Unattached edges (unapproximated)   Drainage Amount Minimal   Drainage Description Serosanguineous   Treatment Cleansed;Debridement (Selective)   Wound Properties Date First Assessed: 08/06/16 Time First Assessed:  1345 Wound Type: Other (Comment) Location: Foot Location Orientation: Right;Medial Present on Admission: Yes   Dressing Type Gauze (Comment)  medihoney colloid   Dressing Changed Changed   Dressing Status Old drainage   Dressing Change Frequency Every 3 days   Site / Wound Assessment Friable;Painful;Pink;Red;Yellow   % Wound base Red or Granulating 0%   % Wound base Yellow 100%   Peri-wound Assessment Erythema (blanchable);Edema;Induration;Maceration   Wound Length (cm) --  approx 5cm2 patch with scattered areas   Margins Unattached edges (unapproximated)   Drainage Amount Minimal   Drainage Description Serous   Treatment Cleansed;Debridement (Selective)   Selective Debridement - Location medial ankle only as patient refused Lateral   Selective Debridement - Tools Used Forceps   Selective Debridement - Tissue Removed slough and dry skin   Wound Therapy - Clinical Statement Able to debride approx 20% slough and dead tissue from medial ankle before patient requested therapist stop.  Pt would not allow debridement on lateral ankle.  Pt very hypersenstivive and jerking foot away at attempts to cleanse.  Moisturized area well.  Changed dressing to silver hydrofiber on medial side due to maceration and continued honey colloid to lateral side to promote lymphatic drainage and loosening of eschar.  Reiterated importance of compression and keeping all layers on. Pt with extreme induration of Rt LE.  Used more layers around ankle to achieve cone  shape to increase compreassion and healing.    Hydrotherapy Plan Debridement;Dressing change   Wound Therapy - Frequency Other (comment)   Wound Therapy - Current Recommendations --  2X week   Wound Plan Continue with debreidment as allowed, appropriate dressing change and compression.   Dressing  honey colloid to lateral ankle, silver hydrofiber to medial ankle, profore compression system                     PT Short Term Goals - 07/31/16 1556       PT SHORT TERM GOAL #1   Title Pt LE to show no signs of infection    Time 1   Period Weeks   Status New     PT SHORT TERM GOAL #2   Title Pt pain to be decreaed to no more than a 4/10 to allow pt to be on her feet for over an hour for work duties    Time 1   Period Weeks   Status New     PT SHORT TERM GOAL #3   Title Pt wound drainage to be minimal to decrease risk of infection    Time 1   Period Weeks           PT Long Term Goals - 07/31/16 1557      PT LONG TERM GOAL #1   Title Pt wounds to be healed    Time 4   Period Weeks   Status New     PT LONG TERM GOAL #2   Title Pt acknowledge the importance of wearing compression garments everyday; have obtained and to be able to don and doff compression garments.    Time 4   Period Weeks   Status New             Patient will benefit from skilled therapeutic intervention in order to improve the following deficits and impairments:     Visit Diagnosis: Pain in right foot  Non-pressure chronic ulcer of ankle with fat layer exposed, right [facility](HCC)     Problem List Patient Active Problem List   Diagnosis Date Noted  . Recurrent cellulitis of lower leg 06/05/2015  . Obesity 06/05/2015  . H/O: hysterectomy 06/05/2015  . History of cholecystectomy 06/05/2015  . Intertrigo 06/05/2015  . Tibia/fibula fracture, shaft 06/05/2015    Emeline GinsFrazier, Kalissa Grays B 08/11/2016, 5:33 PM  [facility] [address] [place], [place], [address] Phone: [phone]   Fax:  [phone]  Name: Claudette LawsStephanie Repinski MRN: [id number] Date of Birth: [birth year]

## 2016-08-14 ENCOUNTER — Ambulatory Visit (HOSPITAL_COMMUNITY): Payer: Self-pay | Admitting: Physical Therapy

## 2016-08-14 DIAGNOSIS — M79671 Pain in right foot: Secondary | ICD-10-CM

## 2016-08-14 DIAGNOSIS — L97312 Non-pressure chronic ulcer of right ankle with fat layer exposed: Secondary | ICD-10-CM

## 2016-08-14 NOTE — Therapy (Signed)
[facility] [address] [place], [place], [address] Phone: [phone]   Fax:  [phone]  Wound Care Therapy  Patient Details  Name: Sarah LawsStephanie Joyce MRN: [id number] Date of Birth: 11/24/1955 Referring Provider: Fara ChutePaul Sasser  Encounter Date: 08/14/2016      PT End of Session - 08/14/16 1615    Visit Number 5   Number of Visits 8   Date for PT Re-Evaluation 08/30/16   Authorization Type no insurance   PT Start Time 1300   PT Stop Time 1345   PT Time Calculation (min) 45 min   Activity Tolerance Patient tolerated treatment well   Behavior During Therapy [facility] for [facility] assessed/performed      No past medical history on file.  No past surgical history on file.  There were no vitals filed for this visit.                  Wound Therapy - 08/14/16 1354    Subjective Pt only removed the coban layer this session.     Pain Assessment 0-10   Pain Score 4    Pain Type Acute pain   Pain Location Leg   Pain Descriptors / Indicators Burning   Wound Properties Date First Assessed: 08/11/16 Time First Assessed: 1344 Wound Type: Other (Comment) Location: Foot Location Orientation: Right;Lateral Wound Description (Comments): lateral wound (spider bite) Present on Admission: No   Dressing Type Gauze (Comment)  medihoney colloid   Dressing Changed Changed   Dressing Status Old drainage   Dressing Change Frequency Every 3 days   Site / Wound Assessment Painful;Pink;Yellow   % Wound base Red or Granulating 50%   % Wound base Yellow 50%   Peri-wound Assessment Erythema (blanchable);Edema;Induration   Margins Unattached edges (unapproximated)   Drainage Amount Minimal   Drainage Description Serosanguineous   Treatment Cleansed;Debridement (Selective)   Wound Properties Date First Assessed: 08/06/16 Time First Assessed: 1345 Wound Type: Other (Comment) Location: Foot Location Orientation: Right;Medial Present on Admission: Yes   Dressing Type Gauze (Comment)  medihoney colloid   Dressing Changed Changed   Dressing Status Old drainage   Dressing Change Frequency Every 3 days   Site / Wound Assessment Friable;Painful;Pink;Red;Yellow   % Wound base Red or Granulating 50%   % Wound base Yellow 50%   Peri-wound Assessment Erythema (blanchable);Edema;Induration;Maceration   Margins Unattached edges (unapproximated)   Drainage Amount Minimal   Drainage Description Serous   Treatment Cleansed;Debridement (Selective)   Selective Debridement - Location medial ankle only as patient refused Lateral   Selective Debridement - Tools Used Forceps   Selective Debridement - Tissue Removed slough and dry skin   Wound Therapy - Clinical Statement Improved granualation noted this session, however still hypersensitve only allowing minimal debridement.  Cleansed Rt LE and rebandaged with honey colloid lateral and silver hydrofiber mediallly prior to profore dressing.  Also noted decrease in induration by allowing compressive layer 3 to remain intact.  Encouraged continued compliance with compression.   Hydrotherapy Plan Debridement;Dressing change   Wound Therapy - Frequency Other (comment)   Wound Therapy - Current Recommendations --  2X week   Wound Plan Continue with debreidment as allowed, appropriate dressing change and compression.   Dressing  honey colloid to lateral ankle, silver hydrofiber to medial ankle, profore compression system           [facility] Adult PT Treatment/Exercise - 08/14/16 0001      Manual Therapy   Manual Therapy Compression Bandaging  Manual therapy comments Selective debridement for removal of slough and dry skin.  Retro massage for edema control; Cleansed and debridement with silver hydrofiber dressings and profore with extra cotton    Compression Bandaging medihoney colloid sheet latera, silver hyrdofiber medial f/b profore system using extra cotton to ensure a cone shape prior to other layers of  compression.                   PT Short Term Goals - 07/31/16 1556      PT SHORT TERM GOAL #1   Title Pt LE to show no signs of infection    Time 1   Period Weeks   Status New     PT SHORT TERM GOAL #2   Title Pt pain to be decreaed to no more than a 4/10 to allow pt to be on her feet for over an hour for work duties    Time 1   Period Weeks   Status New     PT SHORT TERM GOAL #3   Title Pt wound drainage to be minimal to decrease risk of infection    Time 1   Period Weeks           PT Long Term Goals - 07/31/16 1557      PT LONG TERM GOAL #1   Title Pt wounds to be healed    Time 4   Period Weeks   Status New     PT LONG TERM GOAL #2   Title Pt acknowledge the importance of wearing compression garments everyday; have obtained and to be able to don and doff compression garments.    Time 4   Period Weeks   Status New             Patient will benefit from skilled therapeutic intervention in order to improve the following deficits and impairments:     Visit Diagnosis: Pain in right foot  Non-pressure chronic ulcer of ankle with fat layer exposed, right [facility])     Problem List Patient Active Problem List   Diagnosis Date Noted  . Recurrent cellulitis of lower leg 06/05/2015  . Obesity 06/05/2015  . H/O: hysterectomy 06/05/2015  . History of cholecystectomy 06/05/2015  . Intertrigo 06/05/2015  . Tibia/fibula fracture, shaft 06/05/2015    [name], PTA/CLT [phone]  08/14/2016, 4:16 PM  [place] [facility] [address] [place], [place], [address] Phone: [phone]   Fax:  [phone]  Name: Sarah Joyce MRN: [id number] Date of Birth: December 23, 1955

## 2016-08-18 ENCOUNTER — Ambulatory Visit (HOSPITAL_COMMUNITY): Payer: Self-pay | Admitting: Physical Therapy

## 2016-08-18 DIAGNOSIS — M79671 Pain in right foot: Secondary | ICD-10-CM

## 2016-08-18 DIAGNOSIS — L97312 Non-pressure chronic ulcer of right ankle with fat layer exposed: Secondary | ICD-10-CM

## 2016-08-18 NOTE — Therapy (Signed)
[place] [facility] [address] [place], [place], [address] Phone: [phone]   Fax:  [phone]  Wound Care Therapy  Patient Details  Name: Sarah LawsStephanie Vandemark MRN: [id number] Date of Birth: 11-20-1955 Referring Provider: Fara ChutePaul Sasser  Encounter Date: 08/18/2016      PT End of Session - 08/18/16 1512    Visit Number 6   Number of Visits 8   Date for PT Re-Evaluation 08/30/16   Authorization Type no insurance   PT Start Time 1425   PT Stop Time 1500   PT Time Calculation (min) 35 min   Activity Tolerance Patient tolerated treatment well   Behavior During Therapy [facility] for tasks assessed/performed      No past medical history on file.  No past surgical history on file.  There were no vitals filed for this visit.                  Wound Therapy - 08/18/16 1518    Subjective Pt worked this weekend but was able to keep her leg elevated.   Pain Assessment 0-10   Pain Score 5    Pain Type Chronic pain   Pain Location Leg   Wound Properties Date First Assessed: 08/11/16 Time First Assessed: 1344 Wound Type: Other (Comment) Location: Foot Location Orientation: Right;Lateral Wound Description (Comments): lateral wound (spider bite) Present on Admission: No   Dressing Type Gauze (Comment)  medihoney colloid   Dressing Changed Changed   Dressing Status Old drainage   Dressing Change Frequency Every 3 days   Site / Wound Assessment Painful;Pink;Yellow   % Wound base Red or Granulating 60%   % Wound base Yellow 40%   Peri-wound Assessment Erythema (blanchable);Edema;Induration   Wound Length (cm) 2 cm  approximate   Wound Width (cm) 2 cm   Wound Depth (cm) 0.4 cm   Margins Unattached edges (unapproximated)   Drainage Amount Minimal   Drainage Description Serosanguineous   Treatment Cleansed;Debridement (Selective);Hydrotherapy (Pulse lavage)   Wound Properties Date First Assessed: 08/06/16 Time First Assessed: 1345 Wound Type:  Other (Comment) Location: Foot Location Orientation: Right;Medial Present on Admission: Yes   Dressing Type Gauze (Comment)  medihoney colloid   Dressing Changed Changed   Dressing Status Old drainage   Dressing Change Frequency Every 3 days   Site / Wound Assessment Friable;Painful;Pink;Red;Yellow   % Wound base Red or Granulating 65%   % Wound base Yellow 35%   Peri-wound Assessment Erythema (blanchable);Edema;Induration;Maceration   Margins Unattached edges (unapproximated)   Drainage Amount Minimal   Drainage Description Serous   Treatment Cleansed;Debridement (Selective)   Pulsed lavage therapy - wound location lateral wound   Pulsed Lavage with Suction (psi) 4 psi   Pulsed Lavage with Suction - Normal Saline Used 1000 mL   Pulsed Lavage Tip Tip with splash shield   Selective Debridement - Location medial ankle only as patient refused Lateral   Selective Debridement - Tools Used Forceps   Selective Debridement - Tissue Removed slough and dry skin   Wound Therapy - Clinical Statement Improved granualation noted this session, however still hypersensitve only allowing minimal debridement.  Cleansed Rt LE and rebandaged with honey colloid lateral and silver hydrofiber mediallly prior to profore dressing.  Also noted decrease in induration by allowing compressive layer 3 to remain intact.  Encouraged continued compliance with compression.   Hydrotherapy Plan Debridement;Dressing change   Wound Therapy - Frequency Other (comment)   Wound Therapy - Current Recommendations --  2X week  Wound Plan Continue with debreidment as allowed, appropriate dressing change and compression.   Dressing  honey colloid to lateral ankle, silver hydrofiber to medial ankle, profore compression system           [facility] Adult PT Treatment/Exercise - 08/18/16 0001      Manual Therapy   Manual Therapy Compression Bandaging   Manual therapy comments Pulse lavage to lateral wound with Selective debridement for  removal of slough and dry skin to medial wound.  Lateral wound with silver hydrofiber with medihoney to medial aspect.    Compression Bandaging profore multilayer compression bandaging with extra cotton placed to ensure a cone shape.                  PT Education - 08/18/16 1512    Education provided Yes   Education Details To take bandage off if it becomes wet or painful   Person(s) Educated Patient   Methods Explanation   Comprehension Verbalized understanding          PT Short Term Goals - 07/31/16 1556      PT SHORT TERM GOAL #1   Title Pt LE to show no signs of infection    Time 1   Period Weeks   Status New     PT SHORT TERM GOAL #2   Title Pt pain to be decreaed to no more than a 4/10 to allow pt to be on her feet for over an hour for work duties    Time 1   Period Weeks   Status New     PT SHORT TERM GOAL #3   Title Pt wound drainage to be minimal to decrease risk of infection    Time 1   Period Weeks           PT Long Term Goals - 07/31/16 1557      PT LONG TERM GOAL #1   Title Pt wounds to be healed    Time 4   Period Weeks   Status New     PT LONG TERM GOAL #2   Title Pt acknowledge the importance of wearing compression garments everyday; have obtained and to be able to don and doff compression garments.    Time 4   Period Weeks   Status New               Plan - 08/18/16 1513    Clinical Impression Statement Pt to department with coban layer removed stating that the dressing was falling down.  Pt still has redness around lateral wound therapist faxed over an order for request to culture lateral wound.  Therapist began pulse lavage to lateral wound as pt can not tolerate debridement.    Rehab Potential Good   PT Frequency 2x / week   PT Duration 4 weeks   PT Treatment/Interventions ADLs/Self Care Home Management;Manual techniques;Manual lymph drainage;Compression bandaging;Other (comment)  debridement and pulse lavage    PT Next  Visit Plan Check on culture order an culture if it has returned.  Continue with manual if redness has decreased    Consulted and Agree with Plan of Care Patient      Patient will benefit from skilled therapeutic intervention in order to improve the following deficits and impairments:  Increased edema, Pain, Other (comment) (nonhealing wound)  Visit Diagnosis: Pain in right foot  Non-pressure chronic ulcer of ankle with fat layer exposed, right [facility])     Problem List Patient Active Problem List  Diagnosis Date Noted  . Recurrent cellulitis of lower leg 06/05/2015  . Obesity 06/05/2015  . H/O: hysterectomy 06/05/2015  . History of cholecystectomy 06/05/2015  . Intertrigo 06/05/2015  . Tibia/fibula fracture, shaft 06/05/2015    Virgina Organ, PT CLT [phone] 08/18/2016, 3:22 PM  [place] [facility] [address] [place], [place], [address] Phone: [phone]   Fax:  [phone]  Name: Clariece Roesler MRN: [id number] Date of Birth: 11-23-1955

## 2016-08-21 ENCOUNTER — Other Ambulatory Visit (HOSPITAL_COMMUNITY)
Admission: RE | Admit: 2016-08-21 | Discharge: 2016-08-21 | Disposition: A | Discharge status: Home / Self Care | Payer: Self-pay | Source: Ambulatory Visit | Attending: Family Medicine | Admitting: Family Medicine

## 2016-08-21 ENCOUNTER — Ambulatory Visit (HOSPITAL_COMMUNITY): Payer: Self-pay | Admitting: Physical Therapy

## 2016-08-21 DIAGNOSIS — X58XXXA Exposure to other specified factors, initial encounter: Secondary | ICD-10-CM | POA: Insufficient documentation

## 2016-08-21 DIAGNOSIS — L97312 Non-pressure chronic ulcer of right ankle with fat layer exposed: Secondary | ICD-10-CM

## 2016-08-21 DIAGNOSIS — M79671 Pain in right foot: Secondary | ICD-10-CM

## 2016-08-21 DIAGNOSIS — S91001A Unspecified open wound, right ankle, initial encounter: Secondary | ICD-10-CM | POA: Insufficient documentation

## 2016-08-21 NOTE — Therapy (Signed)
[facility] [address] [place], [place], [address] Phone: [phone]   Fax:  [phone]  Wound Care Therapy  Patient Details  Name: Sarah Joyce MRN: [id number] Date of Birth: January 07, 1956 Referring Provider: Fara ChutePaul Sasser  Encounter Date: 08/21/2016      PT End of Session - 08/21/16 1338    Visit Number 7   Number of Visits 16   Date for PT Re-Evaluation 08/30/16   Authorization Type no insurance   Authorization - Visit Number 7   Authorization - Number of Visits 16   PT Start Time 1300   PT Stop Time 1330   PT Time Calculation (min) 30 min   Activity Tolerance Patient tolerated treatment well   Behavior During Therapy [facility] for tasks assessed/performed      No past medical history on file.  No past surgical history on file.  There were no vitals filed for this visit.                  Wound Therapy - 08/21/16 1332    Subjective Pt states that she had to take the coban layer off due to bandaging being to tight.    Pain Assessment 0-10   Pain Score 5    Pain Type Chronic pain   Pain Location Leg   Pain Descriptors / Indicators Burning   Wound Properties Date First Assessed: 08/11/16 Time First Assessed: 1344 Wound Type: Other (Comment) Location: Foot Location Orientation: Right;Lateral Wound Description (Comments): lateral wound (spider bite) Present on Admission: No   Dressing Type Gauze (Comment)  medihoney colloid   Dressing Changed Changed   Dressing Status Old drainage   Dressing Change Frequency Every 3 days   Site / Wound Assessment Painful;Pink;Yellow   % Wound base Red or Granulating 60%   % Wound base Yellow 40%   Peri-wound Assessment Erythema (blanchable);Edema;Induration   Wound Length (cm) 1.2 cm   Wound Width (cm) 1.7 cm   Wound Depth (cm) 0.4 cm   Margins Unattached edges (unapproximated)   Drainage Amount Minimal   Drainage Description Serosanguineous   Treatment Cleansed;Other  (Comment)   Wound Properties Date First Assessed: 08/06/16 Time First Assessed: 1345 Wound Type: Other (Comment) Location: Foot Location Orientation: Right;Medial Present on Admission: Yes   Dressing Type Gauze (Comment)  medihoney colloid   Dressing Changed Changed   Dressing Status Old drainage   Dressing Change Frequency Every 3 days   Site / Wound Assessment Friable;Painful;Pink;Red;Yellow   % Wound base Red or Granulating 55%   % Wound base Yellow 45%   Peri-wound Assessment Erythema (blanchable);Edema;Induration;Maceration   Wound Length (cm) 0.5 cm   Wound Width (cm) 4 cm   Wound Depth (cm) 0.3 cm   Margins Unattached edges (unapproximated)   Drainage Amount Minimal   Drainage Description Serous   Treatment Cleansed;Other (Comment)   Wound Therapy - Clinical Statement Pt cultured today with pt having extreme pain with culturing.  After culture pt would not allow therapist to complete pulse lavage or debridment to wound.  Pt continues to have significant redness around wounds despite the fact that this is her second bout of antibiotic.  Therapist added extra cotten to the compression wrap around ankle to ensure conical shape.     Hydrotherapy Plan Debridement;Dressing change   Wound Therapy - Frequency Other (comment)   Wound Therapy - Current Recommendations --  2X week   Wound Plan Continue with debreidment as allowed, appropriate dressing change and compression.  Dressing  silver hydrofiber to both lateral and medial wounds followed by coban lite bandaging system                   PT Short Term Goals - 07/31/16 1556      PT SHORT TERM GOAL #1   Title Pt LE to show no signs of infection    Time 1   Period Weeks   Status New     PT SHORT TERM GOAL #2   Title Pt pain to be decreaed to no more than a 4/10 to allow pt to be on her feet for over an hour for work duties    Time 1   Period Weeks   Status New     PT SHORT TERM GOAL #3   Title Pt wound drainage  to be minimal to decrease risk of infection    Time 1   Period Weeks           PT Long Term Goals - 07/31/16 1557      PT LONG TERM GOAL #1   Title Pt wounds to be healed    Time 4   Period Weeks   Status New     PT LONG TERM GOAL #2   Title Pt acknowledge the importance of wearing compression garments everyday; have obtained and to be able to don and doff compression garments.    Time 4   Period Weeks   Status New               Plan - 08/21/16 1339    Clinical Impression Statement see above    Rehab Potential Good   PT Frequency 2x / week   PT Duration 4 weeks   PT Treatment/Interventions ADLs/Self Care Home Management;Manual techniques;Manual lymph drainage;Compression bandaging;Other (comment)  debridement and pulse lavage    PT Next Visit Plan check on culture reassess with new certification needed    Consulted and Agree with Plan of Care Patient      Patient will benefit from skilled therapeutic intervention in order to improve the following deficits and impairments:  Increased edema, Pain, Other (comment) (nonhealing wound)  Visit Diagnosis: Pain in right foot  Non-pressure chronic ulcer of ankle with fat layer exposed, right [facility])     Problem List Patient Active Problem List   Diagnosis Date Noted  . Recurrent cellulitis of lower leg 06/05/2015  . Obesity 06/05/2015  . H/O: hysterectomy 06/05/2015  . History of cholecystectomy 06/05/2015  . Intertrigo 06/05/2015  . Tibia/fibula fracture, shaft 06/05/2015    Virgina Organ, PT CLT [phone] 08/21/2016, 1:40 PM  [facility] [address] [place], [place], [address] Phone: [phone]   Fax:  [phone]  Name: Sarah Joyce MRN: [id number] Date of Birth: 1956/08/29

## 2016-08-22 ENCOUNTER — Telehealth (HOSPITAL_COMMUNITY): Payer: Self-pay

## 2016-08-22 NOTE — Telephone Encounter (Signed)
08/22/16 patient left a message asking to be seen today because she thinks the therapist forgot to put cotton against her wound and she said that it had rubbed the area raw and she was hurting.  I called and offered her a 1:00 or a 1:45 appt today and she said she had a truck coming in today and couldn't come in.  She said that she would wait until her next appt.

## 2016-08-25 ENCOUNTER — Ambulatory Visit (HOSPITAL_COMMUNITY): Payer: Self-pay | Admitting: Physical Therapy

## 2016-08-25 DIAGNOSIS — M79671 Pain in right foot: Secondary | ICD-10-CM

## 2016-08-25 DIAGNOSIS — L97312 Non-pressure chronic ulcer of right ankle with fat layer exposed: Secondary | ICD-10-CM

## 2016-08-25 LAB — AEROBIC CULTURE W GRAM STAIN (SUPERFICIAL SPECIMEN)

## 2016-08-25 LAB — AEROBIC CULTURE  (SUPERFICIAL SPECIMEN)

## 2016-08-25 NOTE — Therapy (Signed)
[facility] [address] [place], [place], [address] Phone: [phone]   Fax:  [phone]  Wound Care Therapy  Patient Details  Name: Sarah Joyce MRN: [id number] Date of Birth: January 17, 1956 Referring Provider: Fara ChutePaul Sasser  Encounter Date: 08/25/2016      PT End of Session - 08/25/16 1522    Visit Number 8   Number of Visits 16   Date for PT Re-Evaluation 08/30/16   Authorization Type no insurance   Authorization - Visit Number 8   Authorization - Number of Visits 16   PT Start Time 1310   PT Stop Time 1340   PT Time Calculation (min) 30 min   Activity Tolerance Patient tolerated treatment well   Behavior During Therapy [facility] for tasks assessed/performed      No past medical history on file.  No past surgical history on file.  There were no vitals filed for this visit.                  Wound Therapy - 08/25/16 1516    Subjective Pt comes today with coban 2 bandage system intact.  States she can't wait to get it off as it is uncomfortable and has slid down.   Pain Assessment 0-10   Pain Score 5    Pain Type Chronic pain   Pain Location Leg   Pain Descriptors / Indicators Burning   Wound Properties Date First Assessed: 08/11/16 Time First Assessed: 1344 Wound Type: Other (Comment) Location: Foot Location Orientation: Right;Lateral Wound Description (Comments): lateral wound (spider bite) Present on Admission: No   Dressing Type Gauze (Comment)  silver hydrofiber   Dressing Changed Changed   Dressing Status Old drainage   Dressing Change Frequency Every 3 days   Site / Wound Assessment Painful;Pink;Yellow   % Wound base Red or Granulating 60%   % Wound base Yellow 40%   Peri-wound Assessment Erythema (blanchable);Edema;Induration   Margins Unattached edges (unapproximated)   Drainage Amount Moderate   Drainage Description Serosanguineous   Treatment Cleansed;Debridement (Selective)   Wound Properties Date  First Assessed: 08/06/16 Time First Assessed: 1345 Wound Type: Other (Comment) Location: Foot Location Orientation: Right;Medial Present on Admission: Yes   Dressing Type Gauze (Comment)  silver hydrofiber   Dressing Changed Changed   Dressing Status Old drainage   Dressing Change Frequency Every 3 days   Site / Wound Assessment Friable;Painful;Pink;Red;Yellow   % Wound base Red or Granulating 55%   % Wound base Yellow 45%   Peri-wound Assessment Erythema (blanchable);Edema;Induration;Maceration   Margins Unattached edges (unapproximated)   Drainage Amount Minimal   Drainage Description Serous   Treatment Cleansed;Debridement (Selective)   Selective Debridement - Location medial ankle only as patient refused Lateral   Selective Debridement - Tools Used Scalpel   Selective Debridement - Tissue Removed slough    Wound Therapy - Clinical Statement Pt continues to be in alot of pain with inabiltiy to effectively debride.  Unable to debride lateral wound at all.  Pt tearful during session with only gentle touch and cleansing of wounds.  Cultures have not been returned yet.  Resumed medihoney on lateral wound and continued with silver hydrofiber on medial wound.  Profore system used as well.  Sketched borders of erythema medially and laterally to assess if gets larger.  May want to revisit next visit to determine if she needs to return to MD as limited progress with therapy.   Hydrotherapy Plan Debridement;Dressing change   Wound Therapy -  Frequency Other (comment)   Wound Therapy - Current Recommendations --  2X week   Wound Plan Continue with debreidment as allowed, appropriate dressing change and compression.   Dressing  silver hydrofiber to both lateral and medial wounds followed by coban lite bandaging system                   PT Short Term Goals - 07/31/16 1556      PT SHORT TERM GOAL #1   Title Pt LE to show no signs of infection    Time 1   Period Weeks   Status New      PT SHORT TERM GOAL #2   Title Pt pain to be decreaed to no more than a 4/10 to allow pt to be on her feet for over an hour for work duties    Time 1   Period Weeks   Status New     PT SHORT TERM GOAL #3   Title Pt wound drainage to be minimal to decrease risk of infection    Time 1   Period Weeks           PT Long Term Goals - 07/31/16 1557      PT LONG TERM GOAL #1   Title Pt wounds to be healed    Time 4   Period Weeks   Status New     PT LONG TERM GOAL #2   Title Pt acknowledge the importance of wearing compression garments everyday; have obtained and to be able to don and doff compression garments.    Time 4   Period Weeks   Status New             Patient will benefit from skilled therapeutic intervention in order to improve the following deficits and impairments:     Visit Diagnosis: Pain in right foot  Non-pressure chronic ulcer of ankle with fat layer exposed, right [facility])     Problem List Patient Active Problem List   Diagnosis Date Noted  . Recurrent cellulitis of lower leg 06/05/2015  . Obesity 06/05/2015  . H/O: hysterectomy 06/05/2015  . History of cholecystectomy 06/05/2015  . Intertrigo 06/05/2015  . Tibia/fibula fracture, shaft 06/05/2015    Sarah Joyce, PTA/CLT [phone]  08/25/2016, 3:24 PM  [facility] [place] [facility] [address] [place], [place], [address] Phone: [phone]   Fax:  [phone]  Name: Sarah Joyce MRN: [id number] Date of Birth: Apr 13, 1956

## 2016-08-28 ENCOUNTER — Ambulatory Visit (HOSPITAL_COMMUNITY): Payer: Self-pay

## 2016-08-28 DIAGNOSIS — L97312 Non-pressure chronic ulcer of right ankle with fat layer exposed: Secondary | ICD-10-CM

## 2016-08-28 DIAGNOSIS — M79671 Pain in right foot: Secondary | ICD-10-CM

## 2016-08-28 NOTE — Therapy (Signed)
[facility] [address] [place], [place], [address] Phone: [phone]   Fax:  [phone]  Physical Therapy Treatment  Patient Details  Name: Sarah Joyce MRN: [id number] Date of Birth: 13-May-1956 Referring Provider: Fara Chute  Encounter Date: 08/28/2016      PT End of Session - 08/28/16 1736    Visit Number 9   Number of Visits 16   Date for PT Re-Evaluation 08/30/16   Authorization Type no insurance   Authorization - Visit Number 9   Authorization - Number of Visits 16   PT Start Time 1306   PT Stop Time 1346   PT Time Calculation (min) 40 min   Activity Tolerance Patient limited by pain   Behavior During Therapy [facility] for tasks assessed/performed      No past medical history on file.  No past surgical history on file.  There were no vitals filed for this visit.      Subjective Assessment - 08/28/16 1458    Subjective Pt stated she continues to have high pain scale with wound.  Went to MD earlier today who reviewed the culture and antibiotics given.  Current pain scale 7-8/10   Currently in Pain? Yes   Pain Score 8    Pain Location Leg   Pain Orientation Medial;Lateral;Right   Pain Descriptors / Indicators Burning   Pain Type Chronic pain   Pain Onset More than a month ago   Pain Frequency Intermittent   Aggravating Factors  hitting the inside of her ankle   Pain Relieving Factors medication   Effect of Pain on Daily Activities increases pain                       Wound Therapy - 08/28/16 1458    Subjective Pt stated she continues to have high pain scale with wound.  Went to MD earlier today who reviewed the culture and antibiotics given.  Current pain scale 7-8/10   Patient and Family Stated Goals wound to heal   Date of Onset --  15 months ago   Pain Assessment 0-10   Wound Properties Date First Assessed: 08/11/16 Time First Assessed: 1344 Wound Type: Other (Comment) Location: Foot Location  Orientation: Right;Lateral Wound Description (Comments): lateral wound (spider bite) Present on Admission: No   Dressing Type Silver hydrofiber;Gauze (Comment)  medihoney, gauze and profore   Dressing Changed Changed   Dressing Status Old drainage   Dressing Change Frequency Every 3 days   Site / Wound Assessment Painful;Pink;Yellow   % Wound base Red or Granulating 60%   % Wound base Yellow 40%   Peri-wound Assessment Erythema (blanchable);Edema;Induration   Margins Unattached edges (unapproximated)   Drainage Amount Minimal   Drainage Description Serosanguineous   Treatment Cleansed  Unable to complete debridement to lateral aspect due to pain   Wound Properties Date First Assessed: 08/06/16 Time First Assessed: 1345 Wound Type: Other (Comment) Location: Foot Location Orientation: Right;Medial Present on Admission: Yes   Dressing Type Gauze (Comment)  silver hydrofiber, gauze and profore   Dressing Changed Changed   Dressing Status Old drainage   Dressing Change Frequency Every 3 days   Site / Wound Assessment Friable;Painful;Pink;Red;Yellow   % Wound base Red or Granulating 55%   % Wound base Yellow 45%   Peri-wound Assessment Erythema (blanchable);Edema;Induration;Maceration   Margins Unattached edges (unapproximated)   Drainage Amount Minimal   Drainage Description Serous   Treatment Cleansed;Debridement (Selective)   Selective Debridement -  Location medial ankle only as patient refused Lateral   Selective Debridement - Tools Used Scalpel   Selective Debridement - Tissue Removed slough    Wound Therapy - Clinical Statement Pt continues to be in a lot of pain with inabilty to effectively debride.  Pt refused any debridement on lateral wound today and limited by pain in medial aspect.  MD apt earlier today and has began antibiotics.  Erythema does continue though has not expanded beyond tracing done last session.  Continues with medihoney to lateral aspect and silver hydrofiber to  medial with gauze and profore dressings.  Pt signed release of information and notes faxed to MD upon request per pt.     Hydrotherapy Plan Debridement;Dressing change   Wound Therapy - Frequency --  2x/ wk   Wound Plan Continue with debreidment as allowed, appropriate dressing change and compression.   Dressing  silver hydrofiber to medial wounds and medihoney to lateral followed by profore                    PT Short Term Goals - 07/31/16 1556      PT SHORT TERM GOAL #1   Title Pt LE to show no signs of infection    Time 1   Period Weeks   Status New     PT SHORT TERM GOAL #2   Title Pt pain to be decreaed to no more than a 4/10 to allow pt to be on her feet for over an hour for work duties    Time 1   Period Weeks   Status New     PT SHORT TERM GOAL #3   Title Pt wound drainage to be minimal to decrease risk of infection    Time 1   Period Weeks           PT Long Term Goals - 07/31/16 1557      PT LONG TERM GOAL #1   Title Pt wounds to be healed    Time 4   Period Weeks   Status New     PT LONG TERM GOAL #2   Title Pt acknowledge the importance of wearing compression garments everyday; have obtained and to be able to don and doff compression garments.    Time 4   Period Weeks   Status New             Patient will benefit from skilled therapeutic intervention in order to improve the following deficits and impairments:     Visit Diagnosis: Pain in right foot  Non-pressure chronic ulcer of ankle with fat layer exposed, right [facility](HCC)     Problem List Patient Active Problem List   Diagnosis Date Noted  . Recurrent cellulitis of lower leg 06/05/2015  . Obesity 06/05/2015  . H/O: hysterectomy 06/05/2015  . History of cholecystectomy 06/05/2015  . Intertrigo 06/05/2015  . Tibia/fibula fracture, shaft 06/05/2015   Sarah Joyce, Sarah Joyce  Sarah Joyce, Sarah Joyce 08/28/2016, 5:37 PM  [facility] [address] [place], [place], [address] Phone: [phone]   Fax:  [phone]  Name: Sarah LawsStephanie Joyce MRN: [id number] Date of Birth: 23-Dec-1955

## 2016-09-01 ENCOUNTER — Ambulatory Visit (HOSPITAL_COMMUNITY): Payer: Self-pay | Admitting: Physical Therapy

## 2016-09-01 DIAGNOSIS — L97312 Non-pressure chronic ulcer of right ankle with fat layer exposed: Secondary | ICD-10-CM

## 2016-09-01 DIAGNOSIS — M79671 Pain in right foot: Secondary | ICD-10-CM

## 2016-09-01 NOTE — Therapy (Signed)
[place], [place], [address] Phone: [phone]   Fax:  [phone]  Wound Care Therapy  Patient Details  Name: Sarah Joyce MRN: [id number] Date of Birth: Feb 18, 1956 Referring Provider: Consuello Masse  Encounter Date: 09/01/2016      PT End of Session - 09/01/16 1422    Visit Number 10   Number of Visits 16   Date for PT Re-Evaluation 08/30/16   Authorization Type no insurance   Authorization - Visit Number 10   Authorization - Number of Visits 16   PT Start Time 1300   PT Stop Time 1345   PT Time Calculation (min) 45 min   Activity Tolerance Patient limited by pain   Behavior During Therapy [facility] for tasks assessed/performed      No past medical history on file.  No past surgical history on file.  There were no vitals filed for this visit.                  Wound Therapy - 09/01/16 1410    Subjective Pt states that she can not tell if the antibiotics are helping or not.    Patient and Family Stated Goals wound to heal   Date of Onset --  15 months ago   Pain Assessment 0-10   Pain Score 5    Pain Type Chronic pain   Pain Location Leg   Pain Orientation Lower   Pain Descriptors / Indicators Burning;Constant   Wound Properties Date First Assessed: 08/11/16 Time First Assessed: 1344 Wound Type: Other (Comment) Location: Foot Location Orientation: Right;Lateral Wound Description (Comments): lateral wound (spider bite) Present on Admission: No   Dressing Type Silver hydrofiber;Gauze (Comment)  medihoney, gauze and profore   Dressing Changed Changed   Dressing Status Old drainage   Dressing Change Frequency Every 3 days   Site / Wound Assessment Painful;Pink;Yellow   % Wound base Red or Granulating 50%   % Wound base Yellow 50%   Peri-wound Assessment Erythema (blanchable);Edema;Induration   Wound Length (cm) 1 cm  was 1   Wound Width (cm) 2 cm  was 1   Wound Depth (cm) 0.3 cm   Margins Unattached edges (unapproximated)   Drainage Amount Minimal   Drainage Description Serous   Treatment Cleansed;Other (Comment)  manual for decongestion    Wound Properties Date First Assessed: 08/06/16 Time First Assessed: 1345 Wound Type: Other (Comment) Location: Foot Location Orientation: Right;Medial Present on Admission: Yes   Dressing Type Gauze (Comment)  silver hydrofiber, gauze and profore   Dressing Changed Changed   Dressing Status Old drainage   Dressing Change Frequency Every 3 days   Site / Wound Assessment Friable;Painful;Pink;Red;Yellow   % Wound base Red or Granulating 60%   % Wound base Yellow 40%   Peri-wound Assessment Erythema (blanchable);Edema;Induration;Maceration   Wound Length (cm) --  now 3 seperate wounds the greatest being 1x.3 cmwas multiple   Wound Depth (cm) 0.2 cm   Margins Unattached edges (unapproximated)   Drainage Amount Minimal   Drainage Description Serous   Treatment Cleansed;Debridement (Selective);Other (Comment)   Selective Debridement - Location medial ankle only as patient refused Lateral   Selective Debridement - Tools Used Forceps   Selective Debridement - Tissue Removed slough   minimal manual completed due to pain level    Wound Therapy - Clinical Statement Pt pain has decreased.  Wounds measured today with overall decrease in depth and size since last week.  Wounds were  regressing; culture was taken and pt started on a new antibiotic last week.  Noted increased dryness and induration of LE therefore LE lotioned as well as vaseline and manual techniques completed to decrease induration.     Hydrotherapy Plan Debridement;Dressing change;Other (comment)  begin manual to decrease induration    Wound Therapy - Frequency --  2x/ wk   Wound Plan Begin manual drainace techniques to decrease edema to improve healing rate.    Dressing  Silverhydrofiber to lateral wound; medihoney to medial wound.  Lotion and vaseline to LE.  Wounds  covered with 4x4, gauze, cotton, coban and netting                    PT Short Term Goals - 09/01/16 1431      PT SHORT TERM GOAL #1   Title Pt LE to show no signs of infection    Time 1   Period Weeks   Status On-going     PT SHORT TERM GOAL #2   Title Pt pain to be decreaed to no more than a 4/10 to allow pt to be on her feet for over an hour for work duties    Time 1   Period Weeks   Status On-going     PT SHORT TERM GOAL #3   Title Pt wound drainage to be minimal to decrease risk of infection    Time 1   Period Weeks   Status On-going           PT Long Term Goals - 09/01/16 1431      PT LONG TERM GOAL #1   Title Pt wounds to be healed    Time 4   Period Weeks   Status Not Met     PT LONG TERM GOAL #2   Title Pt acknowledge the importance of wearing compression garments everyday; have obtained and to be able to don and doff compression garments.    Time 4   Period Weeks   Status On-going               Plan - 09/01/16 1430    Rehab Potential Good   PT Frequency 2x / week   PT Duration 4 weeks   PT Treatment/Interventions ADLs/Self Care Home Management;Manual techniques;Manual lymph drainage;Compression bandaging;Other (comment)  debridement and pulse lavage    PT Next Visit Plan Continue with manual to decrease edema and debridement as tolerated.     Consulted and Agree with Plan of Care Patient      Patient will benefit from skilled therapeutic intervention in order to improve the following deficits and impairments:  Increased edema, Pain, Other (comment) (nonhealing wound)  Visit Diagnosis: Non-pressure chronic ulcer of ankle with fat layer exposed, right (HCC)  Pain in right foot     Problem List Patient Active Problem List   Diagnosis Date Noted  . Recurrent cellulitis of lower leg 06/05/2015  . Obesity 06/05/2015  . H/O: hysterectomy 06/05/2015  . History of cholecystectomy 06/05/2015  . Intertrigo 06/05/2015  .  Tibia/fibula fracture, shaft 06/05/2015  Sarah Joyce, PT CLT [phone] 09/01/2016, 2:32 PM  [place] [address] [place], [place], [address] Phone: [phone]   Fax:  [phone]  Name: Sarah Joyce MRN: [id number] Date of Birth: Apr 03, 1956

## 2016-09-02 NOTE — Therapy (Signed)
[place], [place], [address] Phone: [phone]   Fax:  [phone]  Wound Care Therapy  Patient Details  Name: Sarah Joyce MRN: [id number] Date of Birth: [birth year] Referring Provider: Consuello Masse  Encounter Date: 09/01/2016      PT End of Session - 09/01/16 1422    Visit Number 10   Number of Visits 16   Date for PT Re-Evaluation 08/30/16   Authorization Type no insurance   Authorization - Visit Number 10   Authorization - Number of Visits 16   PT Start Time 1300   PT Stop Time 1345   PT Time Calculation (min) 45 min   Activity Tolerance Patient limited by pain   Behavior During Therapy [facility] for tasks assessed/performed      No past medical history on file.  No past surgical history on file.  There were no vitals filed for this visit.                  Wound Therapy - 09/01/16 1410    Subjective Pt states that she can not tell if the antibiotics are helping or not.    Patient and Family Stated Goals wound to heal   Date of Onset --  15 months ago   Pain Assessment 0-10   Pain Score 5    Pain Type Chronic pain   Pain Location Leg   Pain Orientation Lower   Pain Descriptors / Indicators Burning;Constant   Wound Properties Date First Assessed: 08/11/16 Time First Assessed: 1344 Wound Type: Other (Comment) Location: Foot Location Orientation: Right;Lateral Wound Description (Comments): lateral wound (spider bite) Present on Admission: No   Dressing Type Silver hydrofiber;Gauze (Comment)  medihoney, gauze and profore   Dressing Changed Changed   Dressing Status Old drainage   Dressing Change Frequency Every 3 days   Site / Wound Assessment Painful;Pink;Yellow   % Wound base Red or Granulating 50%   % Wound base Yellow 50%   Peri-wound Assessment Erythema (blanchable);Edema;Induration   Wound Length (cm) 1 cm  was 1   Wound Width (cm) 2 cm  was 1   Wound Depth (cm) 0.3 cm   Margins Unattached edges (unapproximated)   Drainage Amount Minimal   Drainage Description Serous   Treatment Cleansed;Other (Comment)  manual for decongestion    Wound Properties Date First Assessed: 08/06/16 Time First Assessed: 1345 Wound Type: Other (Comment) Location: Foot Location Orientation: Right;Medial Present on Admission: Yes   Dressing Type Gauze (Comment)  silver hydrofiber, gauze and profore   Dressing Changed Changed   Dressing Status Old drainage   Dressing Change Frequency Every 3 days   Site / Wound Assessment Friable;Painful;Pink;Red;Yellow   % Wound base Red or Granulating 60%   % Wound base Yellow 40%   Peri-wound Assessment Erythema (blanchable);Edema;Induration;Maceration   Wound Length (cm) --  now 3 seperate wounds the greatest being 1x.3 cmwas multiple   Wound Depth (cm) 0.2 cm   Margins Unattached edges (unapproximated)   Drainage Amount Minimal   Drainage Description Serous   Treatment Cleansed;Debridement (Selective);Other (Comment)   Selective Debridement - Location medial ankle only as patient refused Lateral   Selective Debridement - Tools Used Forceps   Selective Debridement - Tissue Removed slough   minimal manual completed due to pain level    Wound Therapy - Clinical Statement Pt pain has decreased.  Wounds measured today with overall decrease in depth and size.  Noted increaded dryness and induration  of LE therefore LE lotioned as well as vaseline and manual techniques completed to decrease induration.     Hydrotherapy Plan Debridement;Dressing change;Other (comment)  begin manual to decrease induration    Wound Therapy - Frequency --  2x/ wk   Wound Plan Begin manual drainace techniques to decrease edema to improve healing rate.    Dressing  Silverhydrofiber to lateral wound; medihoney to medial wound.  Lotion and vaseline to LE.  Wounds covered with 4x4, gauze, cotton, coban and netting                    PT Short Term Goals -  09/01/16 1431      PT SHORT TERM GOAL #1   Title Pt LE to show no signs of infection    Time 1   Period Weeks   Status On-going     PT SHORT TERM GOAL #2   Title Pt pain to be decreaed to no more than a 4/10 to allow pt to be on her feet for over an hour for work duties    Time 1   Period Weeks   Status On-going     PT SHORT TERM GOAL #3   Title Pt wound drainage to be minimal to decrease risk of infection    Time 1   Period Weeks   Status On-going           PT Long Term Goals - 09/01/16 1431      PT LONG TERM GOAL #1   Title Pt wounds to be healed    Time 4   Period Weeks   Status Not Met     PT LONG TERM GOAL #2   Title Pt acknowledge the importance of wearing compression garments everyday; have obtained and to be able to don and doff compression garments.    Time 4   Period Weeks   Status On-going               Plan - 09/01/16 1430    Rehab Potential Good   PT Frequency 2x / week   PT Duration 4 weeks   PT Treatment/Interventions ADLs/Self Care Home Management;Manual techniques;Manual lymph drainage;Compression bandaging;Other (comment)  debridement and pulse lavage    PT Next Visit Plan Continue with manual to decrease edema and debridement as tolerated.     Consulted and Agree with Plan of Care Patient      Patient will benefit from skilled therapeutic intervention in order to improve the following deficits and impairments:  Increased edema, Pain, Other (comment) (nonhealing wound)  Visit Diagnosis: Non-pressure chronic ulcer of ankle with fat layer exposed, right (HCC)  Pain in right foot     Problem List Patient Active Problem List   Diagnosis Date Noted  . Recurrent cellulitis of lower leg 06/05/2015  . Obesity 06/05/2015  . H/O: hysterectomy 06/05/2015  . History of cholecystectomy 06/05/2015  . Intertrigo 06/05/2015  . Tibia/fibula fracture, shaft 06/05/2015    Rayetta Humphrey, PT CLT [phone] 09/02/2016, 8:08 AM  [place] [address] [place], [place], [address] Phone: [phone]   Fax:  [phone]  Name: Sarah Joyce MRN: [id number] Date of Birth: [birth year]

## 2016-09-02 NOTE — Addendum Note (Signed)
Addended by: Bella KennedyUSSELL, CYNTHIA J on: 09/02/2016 08:12 AM   Modules accepted: Orders

## 2016-09-04 ENCOUNTER — Ambulatory Visit (HOSPITAL_COMMUNITY): Payer: Self-pay | Admitting: Physical Therapy

## 2016-09-04 DIAGNOSIS — L97312 Non-pressure chronic ulcer of right ankle with fat layer exposed: Secondary | ICD-10-CM

## 2016-09-04 DIAGNOSIS — M79671 Pain in right foot: Secondary | ICD-10-CM

## 2016-09-04 NOTE — Therapy (Signed)
[place], [place], [address] Phone: [phone]   Fax:  [phone]  Wound Care Therapy  Patient Details  Name: Sarah Joyce MRN: [id number] Date of Birth: [birth year] Referring Provider: Consuello Masse   Encounter Date: 09/04/2016      PT End of Session - 09/04/16 1544    Visit Number 11   Number of Visits 16   Date for PT Re-Evaluation 08/30/16   Authorization Type no insurance   Authorization - Visit Number 11   Authorization - Number of Visits 16   PT Start Time 1300   PT Stop Time 1340   PT Time Calculation (min) 40 min   Activity Tolerance Patient limited by pain   Behavior During Therapy [facility] for tasks assessed/performed      No past medical history on file.  No past surgical history on file.  There were no vitals filed for this visit.                  Wound Therapy - 09/04/16 1537    Subjective PT states her leg feels better today.   Patient and Family Stated Goals wound to heal   Date of Onset --  15 months ago   Pain Assessment 0-10   Pain Score 4    Pain Type Chronic pain   Pain Location Leg   Pain Orientation Lower   Pain Descriptors / Indicators Burning   Wound Properties Date First Assessed: 08/11/16 Time First Assessed: 1344 Wound Type: Other (Comment) Location: Foot Location Orientation: Right;Lateral Wound Description (Comments): lateral wound (spider bite) Present on Admission: No   Dressing Type Silver hydrofiber  medihoney, gauze and profore   Dressing Changed Changed   Dressing Status Old drainage   Dressing Change Frequency Every 3 days   Site / Wound Assessment Painful;Pink;Yellow   % Wound base Red or Granulating 50%   % Wound base Yellow 50%   Peri-wound Assessment Erythema (blanchable);Edema;Induration   Margins Unattached edges (unapproximated)   Drainage Amount Minimal   Drainage Description Serous   Treatment Cleansed;Debridement (Selective)   Wound  Properties Date First Assessed: 08/06/16 Time First Assessed: 1345 Wound Type: Other (Comment) Location: Foot Location Orientation: Right;Medial Present on Admission: Yes   Dressing Type Gauze (Comment)  silver hydrofiber, gauze and profore   Dressing Changed Changed   Dressing Status Old drainage   Dressing Change Frequency Every 3 days   Site / Wound Assessment Friable;Painful;Pink;Red;Yellow   % Wound base Red or Granulating 60%   % Wound base Yellow 40%   Peri-wound Assessment Erythema (blanchable);Edema;Induration;Maceration   Margins Unattached edges (unapproximated)   Drainage Amount Minimal   Drainage Description Serous   Treatment Cleansed;Debridement (Selective)   Selective Debridement - Location medial ankle only as patient refused Lateral   Selective Debridement - Tools Used Forceps   Selective Debridement - Tissue Removed slough   minimal manual completed due to pain level    Wound Therapy - Clinical Statement Pt with improved tolerance to manipulation of area.  Cleansed well with warm cloths but did not complete sharps debridement.  Dressed both wounds with medihoney this session, kerlix, cotton around the ankle and coban for compression.   Hydrotherapy Plan Debridement;Dressing change;Other (comment)  begin manual to decrease induration    Wound Therapy - Frequency --  2x/ wk   Wound Plan continue with massage techniques to decrease induration and proper dressings.   Dressing  medihoney to medial and lateral  wound.  Lotion and vaseline to LE.  Wounds covered with 4x4, gauze, cotton, coban and netting    Manual Therapy retro massage                    PT Short Term Goals - 09/01/16 1431      PT SHORT TERM GOAL #1   Title Pt LE to show no signs of infection    Time 1   Period Weeks   Status On-going     PT SHORT TERM GOAL #2   Title Pt pain to be decreaed to no more than a 4/10 to allow pt to be on her feet for over an hour for work duties    Time 1    Period Weeks   Status On-going     PT SHORT TERM GOAL #3   Title Pt wound drainage to be minimal to decrease risk of infection    Time 1   Period Weeks   Status On-going           PT Long Term Goals - 09/01/16 1431      PT LONG TERM GOAL #1   Title Pt wounds to be healed    Time 4   Period Weeks   Status Not Met     PT LONG TERM GOAL #2   Title Pt acknowledge the importance of wearing compression garments everyday; have obtained and to be able to don and doff compression garments.    Time 4   Period Weeks   Status On-going             Patient will benefit from skilled therapeutic intervention in order to improve the following deficits and impairments:     Visit Diagnosis: Non-pressure chronic ulcer of ankle with fat layer exposed, right (HCC)  Pain in right foot     Problem List Patient Active Problem List   Diagnosis Date Noted  . Recurrent cellulitis of lower leg 06/05/2015  . Obesity 06/05/2015  . H/O: hysterectomy 06/05/2015  . History of cholecystectomy 06/05/2015  . Intertrigo 06/05/2015  . Tibia/fibula fracture, shaft 06/05/2015    Amy B Frazier, PTA/CLT [phone]  09/04/2016, 3:46 PM  [facility] [address] [place], [place], [address] Phone: [phone]   Fax:  [phone]  Name: Sarah Joyce MRN: [id number] Date of Birth: 04/11/1956     

## 2016-09-08 ENCOUNTER — Ambulatory Visit (HOSPITAL_COMMUNITY): Payer: Self-pay | Admitting: Physical Therapy

## 2016-09-08 DIAGNOSIS — M79671 Pain in right foot: Secondary | ICD-10-CM

## 2016-09-08 DIAGNOSIS — L97312 Non-pressure chronic ulcer of right ankle with fat layer exposed: Secondary | ICD-10-CM

## 2016-09-08 NOTE — Therapy (Addendum)
[place], [place], [address] Phone: [phone]   Fax:  [phone]  Wound Care Therapy  Patient Details  Name: Sarah Joyce MRN: [id number] Date of Birth: Oct 07, 1956 Referring Provider: Consuello Masse   Encounter Date: 09/08/2016      PT End of Session - 09/08/16 1422    Visit Number 12   Number of Visits 16   Date for PT Re-Evaluation 08/30/16   Authorization Type no insurance   Authorization - Visit Number 12   Authorization - Number of Visits 16   PT Start Time 1300   PT Stop Time 1340   PT Time Calculation (min) 40 min   Activity Tolerance Patient limited by pain   Behavior During Therapy [facility] for tasks assessed/performed      No past medical history on file.  No past surgical history on file.  There were no vitals filed for this visit.                  Wound Therapy - 09/08/16 1345    Subjective Pt states that her dressing fell down.     Patient and Family Stated Goals wound to heal   Date of Onset --  15 months ago   Pain Assessment 0-10   Pain Score 6    Pain Type Chronic pain   Pain Location Leg   Pain Orientation Lower   Pain Descriptors / Indicators Burning   Wound Properties Date First Assessed: 08/11/16 Time First Assessed: 1344 Wound Type: Other (Comment) Location: Foot Location Orientation: Right;Lateral Wound Description (Comments): lateral wound (spider bite) Present on Admission: No   Dressing Type Silver hydrofiber  medihoney, gauze and profore   Dressing Changed Changed   Dressing Status Old drainage   Dressing Change Frequency Every 3 days   Site / Wound Assessment Painful;Pink;Yellow   % Wound base Red or Granulating 40%   % Wound base Yellow 60%   Peri-wound Assessment Erythema (blanchable);Edema;Induration   Wound Length (cm) 1 cm   Wound Width (cm) 1.5 cm   Wound Depth (cm) 0.3 cm   Margins Unattached edges (unapproximated)   Drainage Amount Minimal   Drainage Description Serous   Treatment Cleansed   Wound Properties Date First Assessed: 08/06/16 Time First Assessed: 1345 Wound Type: Other (Comment) Location: Foot Location Orientation: Right;Medial Present on Admission: Yes   Dressing Type Gauze (Comment)  silver hydrofiber, gauze and profore   Dressing Changed Changed   Dressing Status Old drainage   Dressing Change Frequency Every 3 days   Site / Wound Assessment Friable;Painful;Pink;Red;Yellow   % Wound base Red or Granulating 50%   % Wound base Yellow 50%   Peri-wound Assessment Erythema (blanchable);Edema;Induration;Maceration   Wound Length (cm) 1.2 cm   Wound Width (cm) 0.3 cm   Wound Depth (cm) 0.1 cm   Margins Unattached edges (unapproximated)   Drainage Amount Minimal   Drainage Description Serous   Treatment Cleansed;Debridement (Selective)   Selective Debridement - Location medial ankle only as patient refused Lateral   Selective Debridement - Tools Used Forceps   Selective Debridement - Tissue Removed slough   minimal manual completed due to pain level    Wound Therapy - Clinical Statement Pt with improved tolerance to manipulation of area.  Cleansed well with warm cloths but did not complete sharps debridement.  Dressed both wounds with medihoney this session, kerlix, cotton around the ankle and coban for compression.   Hydrotherapy Plan Debridement;Dressing change;Other (  comment)  begin manual to decrease induration    Wound Therapy - Frequency --  decrease to 1 time a week due to intolerance to debridement.   Wound Plan continue with massage techniques to decrease induration and proper dressings.   Dressing  medihoney to medial and lateral  wound.  Lotion and vaseline to LE.  Wounds covered with 4x4, gauze, cotton, coban and netting    Manual Therapy retro massage                  PT Education - 09/08/16 1422    Education provided Yes   Education Details To decrease to one time a week    Person(s)  Educated Patient   Methods Explanation   Comprehension Verbalized understanding          PT Short Term Goals - 09/01/16 1431      PT SHORT TERM GOAL #1   Title Pt LE to show no signs of infection    Time 1   Period Weeks   Status On-going     PT SHORT TERM GOAL #2   Title Pt pain to be decreaed to no more than a 4/10 to allow pt to be on her feet for over an hour for work duties    Time 1   Period Weeks   Status On-going     PT SHORT TERM GOAL #3   Title Pt wound drainage to be minimal to decrease risk of infection    Time 1   Period Weeks   Status On-going           PT Long Term Goals - 09/01/16 1431      PT LONG TERM GOAL #1   Title Pt wounds to be healed    Time 4   Period Weeks   Status Not Met     PT LONG TERM GOAL #2   Title Pt acknowledge the importance of wearing compression garments everyday; have obtained and to be able to don and doff compression garments.    Time 4   Period Weeks   Status On-going               Plan - 09/08/16 1422    Clinical Impression Statement Continued with  decongestive techniques do to induration in calf area.  Overall wound size is decreasing but continutes to have significant slough in wound bed.  Pt is intolerant of debridement therefore will decrease treatment to one time a week.    Rehab Potential Good   PT Frequency 2x / week  decrease to one time beginning 08/29/2016    PT Duration 4 weeks   PT Treatment/Interventions ADLs/Self Care Home Management;Manual techniques;Manual lymph drainage;Compression bandaging;Other (comment)  debridement and pulse lavage    PT Next Visit Plan Continue with manual to decrease edema and debridement as tolerated.     Consulted and Agree with Plan of Care Patient      Patient will benefit from skilled therapeutic intervention in order to improve the following deficits and impairments:  Increased edema, Pain, Other (comment) (nonhealing wound)  Visit Diagnosis: Non-pressure  chronic ulcer of ankle with fat layer exposed, right (HCC)  Pain in right foot     Problem List Patient Active Problem List   Diagnosis Date Noted  . Recurrent cellulitis of lower leg 06/05/2015  . Obesity 06/05/2015  . H/O: hysterectomy 06/05/2015  . History of cholecystectomy 06/05/2015  . Intertrigo 06/05/2015  . Tibia/fibula fracture, shaft 06/05/2015  Rayetta Humphrey, PT CLT [phone] 09/08/2016, 2:24 PM  [place], [place], [address] Phone: [phone]   Fax:  [phone]  Name: Sarah Joyce MRN: [id number] Date of Birth: June 16, 1956   07/21/2018  PHYSICAL THERAPY DISCHARGE SUMMARY  Visits from Start of Care: 12  Current functional level related to goals / functional outcomes: Wound approximating   Remaining deficits: Wound still open   Education / Equipment: Care of wound  Plan: Patient agrees to discharge.  Patient goals were partially met. Patient is being discharged due to not returning since the last visit.  ?????       Rayetta Humphrey, [place] CLT [phone]

## 2016-09-11 ENCOUNTER — Ambulatory Visit (HOSPITAL_COMMUNITY): Payer: Self-pay | Admitting: Physical Therapy

## 2016-09-15 ENCOUNTER — Telehealth (HOSPITAL_COMMUNITY): Payer: Self-pay | Admitting: Family Medicine

## 2016-09-15 NOTE — Telephone Encounter (Signed)
09/15/16 caller cx the 11/7 because Judeth CornfieldStephanie has to work

## 2016-09-16 ENCOUNTER — Ambulatory Visit (HOSPITAL_COMMUNITY): Payer: Self-pay

## 2016-09-18 ENCOUNTER — Ambulatory Visit (HOSPITAL_COMMUNITY): Payer: Self-pay | Admitting: Physical Therapy

## 2016-09-23 ENCOUNTER — Ambulatory Visit (HOSPITAL_COMMUNITY): Payer: Self-pay | Attending: Family Medicine | Admitting: Physical Therapy

## 2016-09-23 ENCOUNTER — Telehealth (HOSPITAL_COMMUNITY): Payer: Self-pay | Admitting: Physical Therapy

## 2016-09-23 NOTE — Telephone Encounter (Signed)
Pt did not show for appointment today.  Called cell phone and spoke to sister who states she called last week to cancel all remaining appointments.  States the last therapist told them she would have to be discharged if she could not tolerate debridement so they cancelled as she cannot tolerate it.  Will discuss with last treating therapist regarding this. Lurena NidaAmy B Sotiria Keast, PTA/CLT [phone]

## 2016-09-25 ENCOUNTER — Ambulatory Visit (HOSPITAL_COMMUNITY): Payer: Self-pay | Admitting: Physical Therapy

## 2016-09-30 ENCOUNTER — Ambulatory Visit (HOSPITAL_COMMUNITY): Payer: Self-pay | Admitting: Physical Therapy

## 2021-08-01 DIAGNOSIS — Z20822 Contact with and (suspected) exposure to covid-19: Secondary | ICD-10-CM | POA: Diagnosis not present

## 2021-08-01 DIAGNOSIS — Z6841 Body Mass Index (BMI) 40.0 and over, adult: Secondary | ICD-10-CM | POA: Diagnosis not present

## 2021-08-01 DIAGNOSIS — J208 Acute bronchitis due to other specified organisms: Secondary | ICD-10-CM | POA: Diagnosis not present

## 2021-08-13 DIAGNOSIS — H5210 Myopia, unspecified eye: Secondary | ICD-10-CM | POA: Diagnosis not present

## 2021-08-13 DIAGNOSIS — Z0100 Encounter for examination of eyes and vision without abnormal findings: Secondary | ICD-10-CM | POA: Diagnosis not present

## 2021-12-20 DIAGNOSIS — M79671 Pain in right foot: Secondary | ICD-10-CM | POA: Diagnosis not present

## 2021-12-20 DIAGNOSIS — M7731 Calcaneal spur, right foot: Secondary | ICD-10-CM | POA: Diagnosis not present

## 2021-12-24 DIAGNOSIS — Z6841 Body Mass Index (BMI) 40.0 and over, adult: Secondary | ICD-10-CM | POA: Diagnosis not present

## 2021-12-24 DIAGNOSIS — I10 Essential (primary) hypertension: Secondary | ICD-10-CM | POA: Diagnosis not present

## 2021-12-24 DIAGNOSIS — M109 Gout, unspecified: Secondary | ICD-10-CM | POA: Diagnosis not present

## 2021-12-25 DIAGNOSIS — M109 Gout, unspecified: Secondary | ICD-10-CM | POA: Diagnosis not present

## 2021-12-25 DIAGNOSIS — R7301 Impaired fasting glucose: Secondary | ICD-10-CM | POA: Diagnosis not present

## 2021-12-25 DIAGNOSIS — I10 Essential (primary) hypertension: Secondary | ICD-10-CM | POA: Diagnosis not present

## 2021-12-25 DIAGNOSIS — Z13220 Encounter for screening for lipoid disorders: Secondary | ICD-10-CM | POA: Diagnosis not present

## 2022-07-17 DIAGNOSIS — M109 Gout, unspecified: Secondary | ICD-10-CM | POA: Diagnosis not present

## 2022-07-17 DIAGNOSIS — I10 Essential (primary) hypertension: Secondary | ICD-10-CM | POA: Diagnosis not present

## 2022-07-17 DIAGNOSIS — R7301 Impaired fasting glucose: Secondary | ICD-10-CM | POA: Diagnosis not present

## 2022-07-21 DIAGNOSIS — N183 Chronic kidney disease, stage 3 unspecified: Secondary | ICD-10-CM | POA: Diagnosis not present

## 2022-07-21 DIAGNOSIS — Z0000 Encounter for general adult medical examination without abnormal findings: Secondary | ICD-10-CM | POA: Diagnosis not present

## 2022-07-21 DIAGNOSIS — M109 Gout, unspecified: Secondary | ICD-10-CM | POA: Diagnosis not present

## 2022-07-21 DIAGNOSIS — Z23 Encounter for immunization: Secondary | ICD-10-CM | POA: Diagnosis not present

## 2022-07-21 DIAGNOSIS — R7301 Impaired fasting glucose: Secondary | ICD-10-CM | POA: Diagnosis not present

## 2022-07-21 DIAGNOSIS — I10 Essential (primary) hypertension: Secondary | ICD-10-CM | POA: Diagnosis not present

## 2022-07-21 DIAGNOSIS — Z6841 Body Mass Index (BMI) 40.0 and over, adult: Secondary | ICD-10-CM | POA: Diagnosis not present

## 2022-08-21 DIAGNOSIS — Z6841 Body Mass Index (BMI) 40.0 and over, adult: Secondary | ICD-10-CM | POA: Diagnosis not present

## 2022-08-21 DIAGNOSIS — I10 Essential (primary) hypertension: Secondary | ICD-10-CM | POA: Diagnosis not present

## 2022-08-25 DIAGNOSIS — J020 Streptococcal pharyngitis: Secondary | ICD-10-CM | POA: Diagnosis not present

## 2022-08-25 DIAGNOSIS — Z6841 Body Mass Index (BMI) 40.0 and over, adult: Secondary | ICD-10-CM | POA: Diagnosis not present

## 2022-11-13 DIAGNOSIS — N183 Chronic kidney disease, stage 3 unspecified: Secondary | ICD-10-CM | POA: Diagnosis not present

## 2022-11-13 DIAGNOSIS — Z13220 Encounter for screening for lipoid disorders: Secondary | ICD-10-CM | POA: Diagnosis not present

## 2022-11-13 DIAGNOSIS — I10 Essential (primary) hypertension: Secondary | ICD-10-CM | POA: Diagnosis not present

## 2022-11-13 DIAGNOSIS — R7301 Impaired fasting glucose: Secondary | ICD-10-CM | POA: Diagnosis not present

## 2022-11-13 DIAGNOSIS — M109 Gout, unspecified: Secondary | ICD-10-CM | POA: Diagnosis not present

## 2022-11-18 DIAGNOSIS — Z6841 Body Mass Index (BMI) 40.0 and over, adult: Secondary | ICD-10-CM | POA: Diagnosis not present

## 2022-11-18 DIAGNOSIS — Z23 Encounter for immunization: Secondary | ICD-10-CM | POA: Diagnosis not present

## 2022-11-18 DIAGNOSIS — I10 Essential (primary) hypertension: Secondary | ICD-10-CM | POA: Diagnosis not present

## 2022-11-18 DIAGNOSIS — M109 Gout, unspecified: Secondary | ICD-10-CM | POA: Diagnosis not present

## 2022-11-18 DIAGNOSIS — R7301 Impaired fasting glucose: Secondary | ICD-10-CM | POA: Diagnosis not present

## 2023-03-12 DIAGNOSIS — I10 Essential (primary) hypertension: Secondary | ICD-10-CM | POA: Diagnosis not present

## 2023-03-12 DIAGNOSIS — N183 Chronic kidney disease, stage 3 unspecified: Secondary | ICD-10-CM | POA: Diagnosis not present

## 2023-03-12 DIAGNOSIS — Z13220 Encounter for screening for lipoid disorders: Secondary | ICD-10-CM | POA: Diagnosis not present

## 2023-03-12 DIAGNOSIS — M109 Gout, unspecified: Secondary | ICD-10-CM | POA: Diagnosis not present

## 2023-03-12 DIAGNOSIS — R7301 Impaired fasting glucose: Secondary | ICD-10-CM | POA: Diagnosis not present

## 2023-03-18 DIAGNOSIS — I10 Essential (primary) hypertension: Secondary | ICD-10-CM | POA: Diagnosis not present

## 2023-03-18 DIAGNOSIS — R7301 Impaired fasting glucose: Secondary | ICD-10-CM | POA: Diagnosis not present

## 2023-03-18 DIAGNOSIS — Z1212 Encounter for screening for malignant neoplasm of rectum: Secondary | ICD-10-CM | POA: Diagnosis not present

## 2023-03-18 DIAGNOSIS — M109 Gout, unspecified: Secondary | ICD-10-CM | POA: Diagnosis not present

## 2023-03-18 DIAGNOSIS — Z6841 Body Mass Index (BMI) 40.0 and over, adult: Secondary | ICD-10-CM | POA: Diagnosis not present

## 2023-03-23 DIAGNOSIS — Z1231 Encounter for screening mammogram for malignant neoplasm of breast: Secondary | ICD-10-CM | POA: Diagnosis not present

## 2023-04-07 DIAGNOSIS — H524 Presbyopia: Secondary | ICD-10-CM | POA: Diagnosis not present

## 2023-04-07 DIAGNOSIS — H353130 Nonexudative age-related macular degeneration, bilateral, stage unspecified: Secondary | ICD-10-CM | POA: Diagnosis not present

## 2023-04-07 DIAGNOSIS — H52223 Regular astigmatism, bilateral: Secondary | ICD-10-CM | POA: Diagnosis not present

## 2023-04-07 DIAGNOSIS — H5213 Myopia, bilateral: Secondary | ICD-10-CM | POA: Diagnosis not present

## 2023-04-07 DIAGNOSIS — H2513 Age-related nuclear cataract, bilateral: Secondary | ICD-10-CM | POA: Diagnosis not present

## 2023-08-11 DIAGNOSIS — Z23 Encounter for immunization: Secondary | ICD-10-CM | POA: Diagnosis not present

## 2023-09-10 DIAGNOSIS — I10 Essential (primary) hypertension: Secondary | ICD-10-CM | POA: Diagnosis not present

## 2023-09-10 DIAGNOSIS — N183 Chronic kidney disease, stage 3 unspecified: Secondary | ICD-10-CM | POA: Diagnosis not present

## 2023-09-10 DIAGNOSIS — M109 Gout, unspecified: Secondary | ICD-10-CM | POA: Diagnosis not present

## 2023-09-10 DIAGNOSIS — R7301 Impaired fasting glucose: Secondary | ICD-10-CM | POA: Diagnosis not present

## 2023-09-14 DIAGNOSIS — Z1212 Encounter for screening for malignant neoplasm of rectum: Secondary | ICD-10-CM | POA: Diagnosis not present

## 2023-09-14 DIAGNOSIS — Z0001 Encounter for general adult medical examination with abnormal findings: Secondary | ICD-10-CM | POA: Diagnosis not present

## 2023-09-14 DIAGNOSIS — E7849 Other hyperlipidemia: Secondary | ICD-10-CM | POA: Diagnosis not present

## 2023-09-14 DIAGNOSIS — M109 Gout, unspecified: Secondary | ICD-10-CM | POA: Diagnosis not present

## 2023-09-14 DIAGNOSIS — R7301 Impaired fasting glucose: Secondary | ICD-10-CM | POA: Diagnosis not present

## 2023-09-14 DIAGNOSIS — I10 Essential (primary) hypertension: Secondary | ICD-10-CM | POA: Diagnosis not present

## 2023-09-14 DIAGNOSIS — Z23 Encounter for immunization: Secondary | ICD-10-CM | POA: Diagnosis not present

## 2023-09-14 DIAGNOSIS — Z6841 Body Mass Index (BMI) 40.0 and over, adult: Secondary | ICD-10-CM | POA: Diagnosis not present

## 2024-02-22 DIAGNOSIS — H9209 Otalgia, unspecified ear: Secondary | ICD-10-CM | POA: Diagnosis not present

## 2024-02-22 DIAGNOSIS — R509 Fever, unspecified: Secondary | ICD-10-CM | POA: Diagnosis not present

## 2024-02-22 DIAGNOSIS — J069 Acute upper respiratory infection, unspecified: Secondary | ICD-10-CM | POA: Diagnosis not present

## 2024-03-08 DIAGNOSIS — E782 Mixed hyperlipidemia: Secondary | ICD-10-CM | POA: Diagnosis not present

## 2024-03-08 DIAGNOSIS — E7849 Other hyperlipidemia: Secondary | ICD-10-CM | POA: Diagnosis not present

## 2024-03-08 DIAGNOSIS — M109 Gout, unspecified: Secondary | ICD-10-CM | POA: Diagnosis not present

## 2024-03-08 DIAGNOSIS — N183 Chronic kidney disease, stage 3 unspecified: Secondary | ICD-10-CM | POA: Diagnosis not present

## 2024-03-08 DIAGNOSIS — R7301 Impaired fasting glucose: Secondary | ICD-10-CM | POA: Diagnosis not present

## 2024-03-15 DIAGNOSIS — M109 Gout, unspecified: Secondary | ICD-10-CM | POA: Diagnosis not present

## 2024-03-15 DIAGNOSIS — I10 Essential (primary) hypertension: Secondary | ICD-10-CM | POA: Diagnosis not present

## 2024-03-15 DIAGNOSIS — E782 Mixed hyperlipidemia: Secondary | ICD-10-CM | POA: Diagnosis not present

## 2024-03-15 DIAGNOSIS — Z6841 Body Mass Index (BMI) 40.0 and over, adult: Secondary | ICD-10-CM | POA: Diagnosis not present

## 2024-03-15 DIAGNOSIS — E7849 Other hyperlipidemia: Secondary | ICD-10-CM | POA: Diagnosis not present

## 2024-06-30 NOTE — Congregational Nurse Program (Signed)
  Pt participated in [place] [employer] event by way of requesting a blood pressure screening  Pt states she is currently on BP meds and monitor her BP at home.  States she had not taken her meds prior to the event   Plan -checked BP and was  120/79 with HR 93 (WNL) -encouraged to self monitor for self knowledge and to have a record for her PCP visits in the future

## 2024-07-04 DIAGNOSIS — E7849 Other hyperlipidemia: Secondary | ICD-10-CM | POA: Diagnosis not present

## 2024-07-04 DIAGNOSIS — N183 Chronic kidney disease, stage 3 unspecified: Secondary | ICD-10-CM | POA: Diagnosis not present

## 2024-07-04 DIAGNOSIS — R7301 Impaired fasting glucose: Secondary | ICD-10-CM | POA: Diagnosis not present

## 2024-07-04 DIAGNOSIS — I10 Essential (primary) hypertension: Secondary | ICD-10-CM | POA: Diagnosis not present

## 2024-07-04 DIAGNOSIS — M109 Gout, unspecified: Secondary | ICD-10-CM | POA: Diagnosis not present

## 2024-07-14 DIAGNOSIS — R7301 Impaired fasting glucose: Secondary | ICD-10-CM | POA: Diagnosis not present

## 2024-07-14 DIAGNOSIS — I10 Essential (primary) hypertension: Secondary | ICD-10-CM | POA: Diagnosis not present

## 2024-07-14 DIAGNOSIS — E782 Mixed hyperlipidemia: Secondary | ICD-10-CM | POA: Diagnosis not present

## 2024-07-14 DIAGNOSIS — Z6841 Body Mass Index (BMI) 40.0 and over, adult: Secondary | ICD-10-CM | POA: Diagnosis not present

## 2024-07-14 DIAGNOSIS — E7849 Other hyperlipidemia: Secondary | ICD-10-CM | POA: Diagnosis not present

## 2024-08-22 DIAGNOSIS — Z23 Encounter for immunization: Secondary | ICD-10-CM | POA: Diagnosis not present
